# Patient Record
Sex: Female | Born: 2005 | Race: Black or African American | Hispanic: No | Marital: Single | Smoking: Never smoker
Health system: Southern US, Community
[De-identification: ages and names within clinical notes are randomized; demographics above are authoritative.]

## PROBLEM LIST (undated history)

## (undated) ENCOUNTER — Inpatient Hospital Stay (HOSPITAL_COMMUNITY): Payer: Self-pay

## (undated) DIAGNOSIS — J45909 Unspecified asthma, uncomplicated: Secondary | ICD-10-CM

## (undated) DIAGNOSIS — D649 Anemia, unspecified: Secondary | ICD-10-CM

## (undated) DIAGNOSIS — F32A Depression, unspecified: Secondary | ICD-10-CM

## (undated) DIAGNOSIS — F419 Anxiety disorder, unspecified: Secondary | ICD-10-CM

## (undated) HISTORY — PX: WISDOM TOOTH EXTRACTION: SHX21

---
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
## 2023-01-18 ENCOUNTER — Ambulatory Visit
Admission: EM | Admit: 2023-01-18 | Discharge: 2023-01-18 | Disposition: A | Discharge status: Home / Self Care | Payer: Medicaid Other | Attending: Internal Medicine | Admitting: Internal Medicine

## 2023-01-18 DIAGNOSIS — J4520 Mild intermittent asthma, uncomplicated: Secondary | ICD-10-CM

## 2023-01-18 MED ORDER — PREDNISONE 10 MG PO TABS: 30.0000 mg | ORAL_TABLET | Freq: Every day | ORAL | 0 refills | Status: DC

## 2023-01-18 MED ORDER — IPRATROPIUM-ALBUTEROL 0.5-2.5 (3) MG/3ML IN SOLN: 3.0000 mL | RESPIRATORY_TRACT | 0 refills | Status: DC | PRN

## 2023-01-18 MED ORDER — ALBUTEROL SULFATE HFA 108 (90 BASE) MCG/ACT IN AERS: 1.0000 | INHALATION_SPRAY | RESPIRATORY_TRACT | 0 refills | Status: DC | PRN

## 2023-01-18 NOTE — Discharge Instructions (Addendum)
Start prednisone to help with your lungs, breathing. Use the albuterol treatments as needed. Get rest, plenty of fluids.

## 2023-01-18 NOTE — ED Triage Notes (Signed)
Pt c/o left side CP and SHOB-using albuterol inhaler yesterday-states she has been having sx since having PNA last year-NAD-steady gait

## 2023-01-18 NOTE — ED Provider Notes (Signed)
[facility]  Note:  This document was prepared using Conservation officer, historic buildings and may include unintentional dictation errors.  MRN: [id number] DOB: 01-Jun-2005  Subjective:   Tina Castaneda is a 17 y.o. female presenting for 1 week history of persistent intermittent shortness of breath, chest tightness, left-sided chest pain.  She is out of her albuterol inhaler.  Has been borrowing her grandmothers nebulizer machine with temporary relief.  Would like to have a nebulizer machine of her own.  No smoking of any kind including cigarettes, cigars, vaping, marijuana use.    No current facility-administered medications for this encounter.  Current Outpatient Medications:    albuterol (VENTOLIN HFA) 108 (90 Base) MCG/ACT inhaler, Inhale 1-2 puffs into the lungs every 6 (six) hours as needed for wheezing or shortness of breath., Disp: 18 g, Rfl: 0   cyclobenzaprine (FLEXERIL) 5 MG tablet, Take 1 tablet (5 mg total) by mouth 3 (three) times daily as needed for muscle spasms., Disp: 30 tablet, Rfl: 0   hydrOXYzine (ATARAX) 25 MG tablet, Take 0.5 tablets (12.5 mg total) by mouth every 8 (eight) hours as needed for itching., Disp: 30 tablet, Rfl: 0   naproxen (NAPROSYN) 375 MG tablet, Take 1 tablet (375 mg total) by mouth 2 (two) times daily with a meal., Disp: 30 tablet, Rfl: 0   omeprazole (PRILOSEC) 20 MG capsule, Take 1 capsule (20 mg total) by mouth daily., Disp: 30 capsule, Rfl: 0   polyethylene glycol powder (MIRALAX) 17 GM/SCOOP powder, Take 255 g by mouth daily as needed for moderate constipation or severe constipation., Disp: 500 g, Rfl: 0   predniSONE (DELTASONE) 20 MG tablet, Take 1 tablet (20 mg total) by mouth daily with breakfast., Disp: 5 tablet, Rfl: 0   No Known Allergies  Past Medical History:  Diagnosis Date   Anemia    Anxiety    Asthma      History reviewed. No pertinent surgical history.  Family History  Problem Relation  Age of Onset   Asthma Sister    Asthma Maternal Grandmother     Social History   Tobacco Use   Smoking status: Never    Passive exposure: Never   Smokeless tobacco: Never  Vaping Use   Vaping status: Never Used  Substance Use Topics   Alcohol use: Not Currently   Drug use: Not Currently    Types: Marijuana    ROS   Objective:   Vitals: BP 112/75 (BP Location: Right Arm)   Pulse 87   Temp 98 F (36.7 C) (Oral)   Resp 16   LMP 01/08/2023   SpO2 99%   Physical Exam Constitutional:      General: She is not in acute distress.    Appearance: Normal appearance. She is well-developed. She is not ill-appearing, toxic-appearing or diaphoretic.  HENT:     Head: Normocephalic and atraumatic.     Right Ear: External ear normal.     Left Ear: External ear normal.     Nose: Nose normal.     Mouth/Throat:     Mouth: Mucous membranes are moist.  Eyes:     General: No scleral icterus.       Right eye: No discharge.        Left eye: No discharge.     Extraocular Movements: Extraocular movements intact.  Cardiovascular:     Rate and Rhythm: Normal rate and regular rhythm.     Heart sounds: Normal heart sounds. No  murmur heard.    No friction rub. No gallop.  Pulmonary:     Effort: Pulmonary effort is normal. No respiratory distress.     Breath sounds: No stridor. No wheezing, rhonchi or rales.     Comments: Slight decrease in lung sounds throughout. Chest:     Chest wall: No tenderness.  Skin:    General: Skin is warm and dry.  Neurological:     General: No focal deficit present.     Mental Status: She is alert and oriented to person, place, and time.  Psychiatric:        Mood and Affect: Mood normal.        Behavior: Behavior normal.    Provided patient with a nebulizer machine in clinic.  Assessment and Plan :   PDMP not reviewed this encounter.  1. Mild intermittent reactive airway disease without complication    Recommend supportive care with her nebulizer  treatments.  Use supportive care otherwise.  Counseled patient on potential for adverse effects with medications prescribed/recommended today, ER and return-to-clinic precautions discussed, patient verbalized understanding.    Wallis Bamberg, PA-C 01/18/23 1216

## 2023-02-08 ENCOUNTER — Ambulatory Visit: Payer: Medicaid Other | Admitting: Nurse Practitioner

## 2023-03-10 IMAGING — CT CT ABD-PELV W/ CM
2 of 4 series · 15 of 46 positions shown, 17 images · IV contrast (OMNIPAQUE 300)
Comparison: None.

CLINICAL DATA: Right lower quadrant pain with appendicitis
suspected.

EXAM:
CT ABDOMEN AND PELVIS WITH CONTRAST
TECHNIQUE: Multidetector CT imaging of the abdomen and pelvis was performed
using the standard protocol following bolus administration of
intravenous contrast.
CONTRAST:  80mL OMNIPAQUE IOHEXOL 300 MG/ML  SOLN

[Series 2: axial st · axial · 0.59mm/px · z∈[+1114,+1514]mm · 12 of 89 slices shown, 14 images]
[im 5/89  soft-tissue]
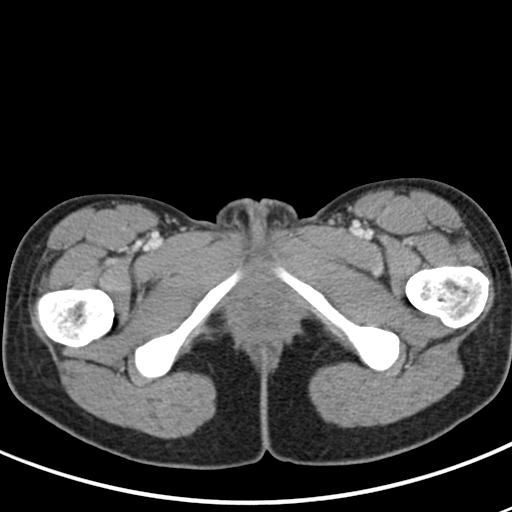
[im 5/89  bone]
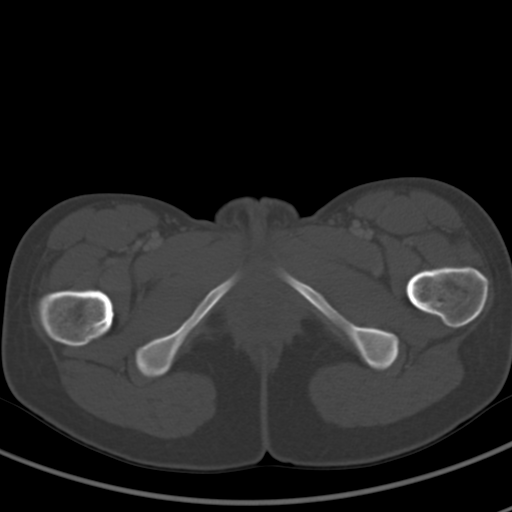
[im 13/89  soft-tissue]
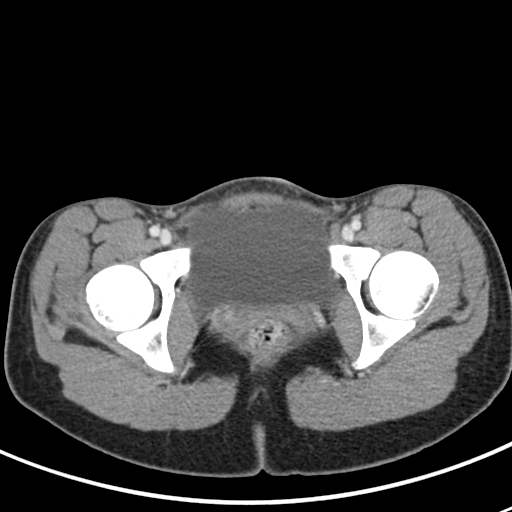
[im 21/89  soft-tissue]
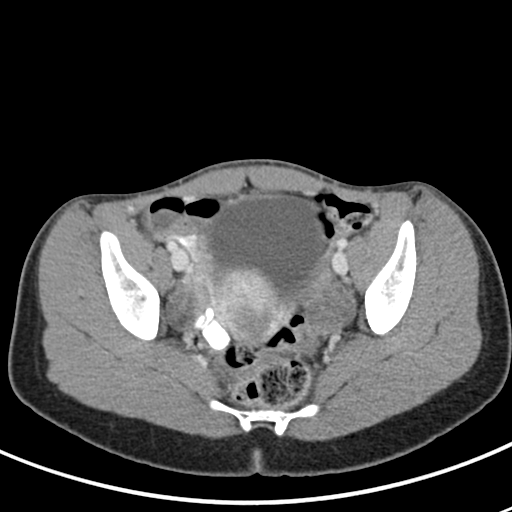
[im 29/89  soft-tissue]
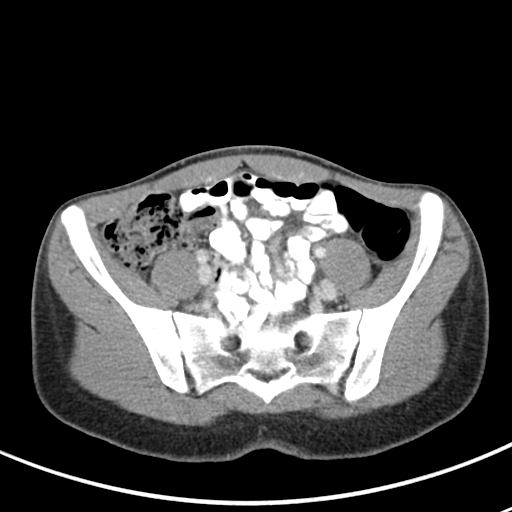
[im 33/89  soft-tissue]
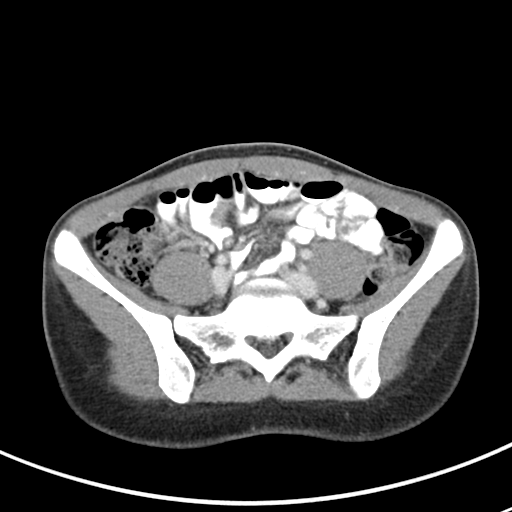
[im 41/89  soft-tissue]
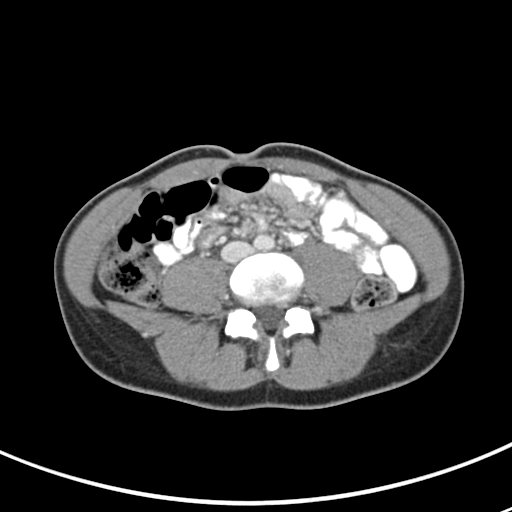
[im 49/89  soft-tissue]
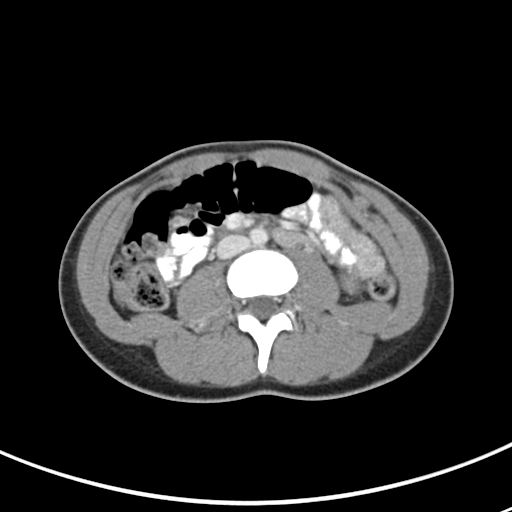
[im 57/89  soft-tissue]
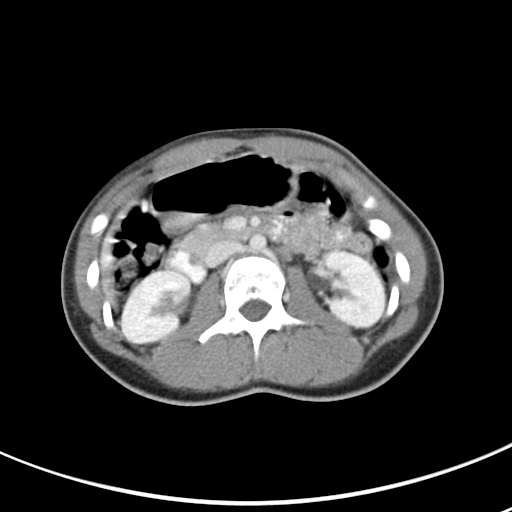
[im 61/89  soft-tissue]
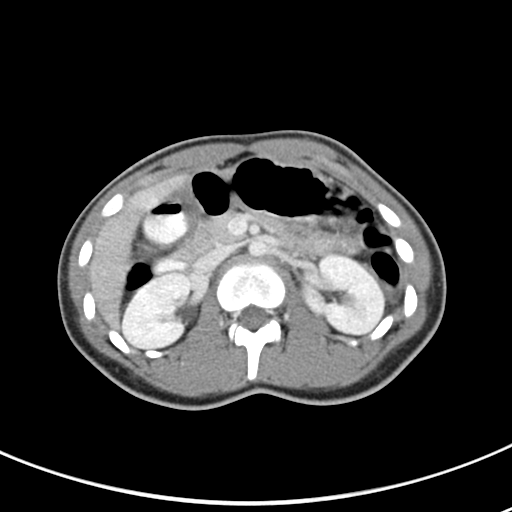
[im 61/89  bone]
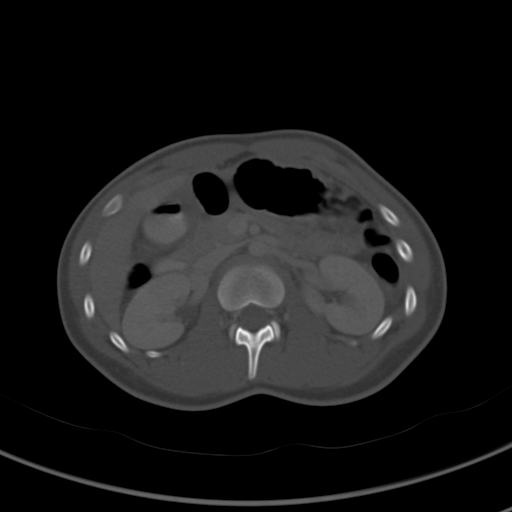
[im 69/89  soft-tissue]
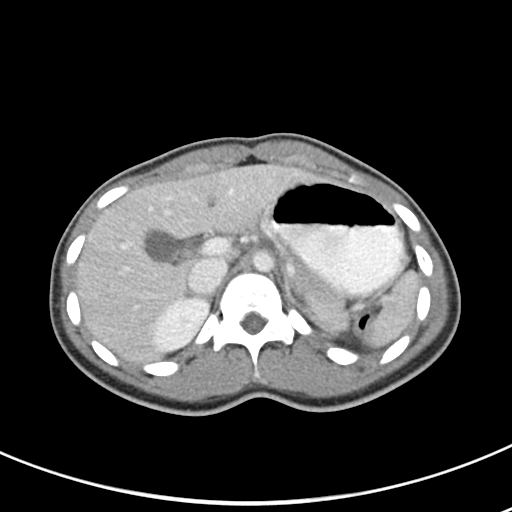
[im 77/89  soft-tissue]
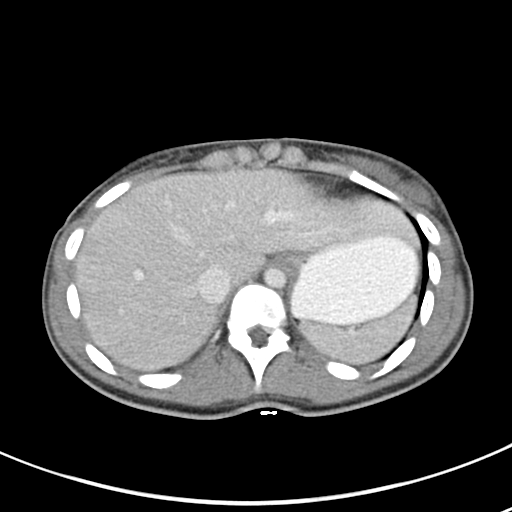
[im 85/89  soft-tissue]
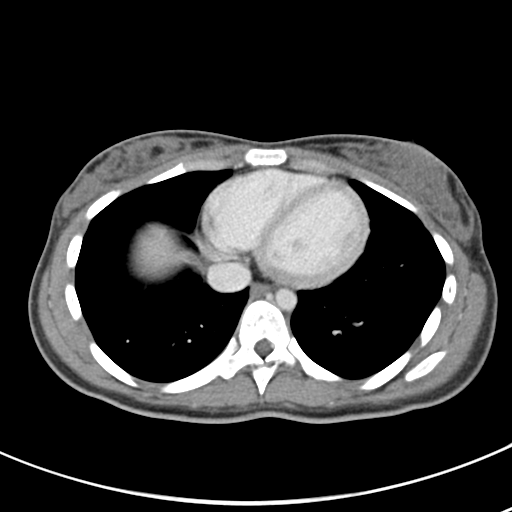

[Series 4: coronal st · coronal · 0.52mm/px · 3 of 100 slices shown]
[im 34/100  soft-tissue]
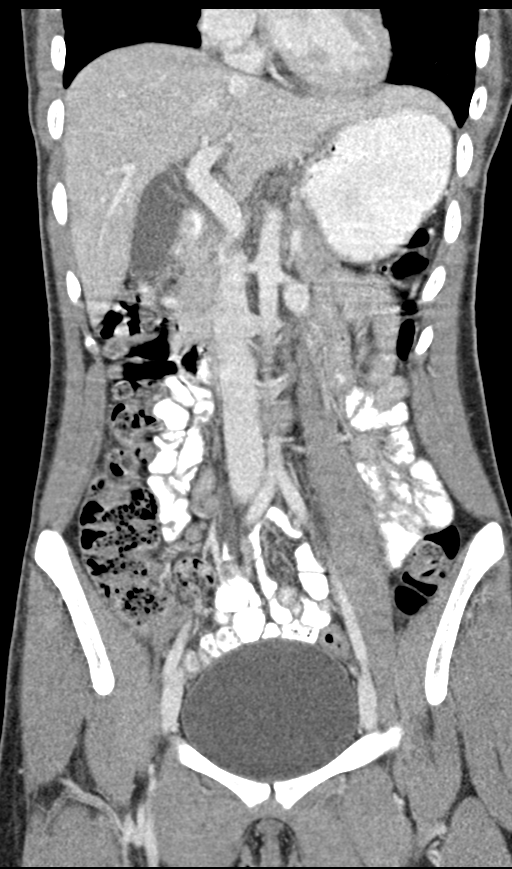
[im 45/100  soft-tissue]
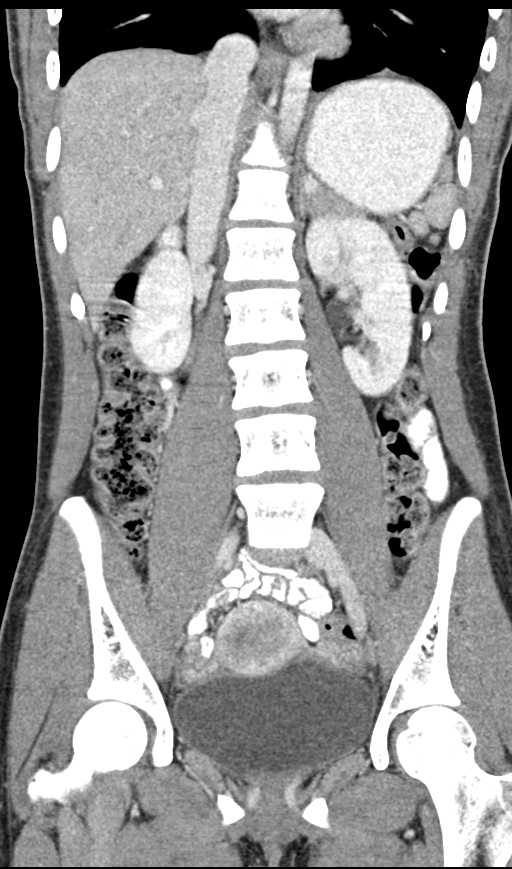
[im 56/100  soft-tissue]
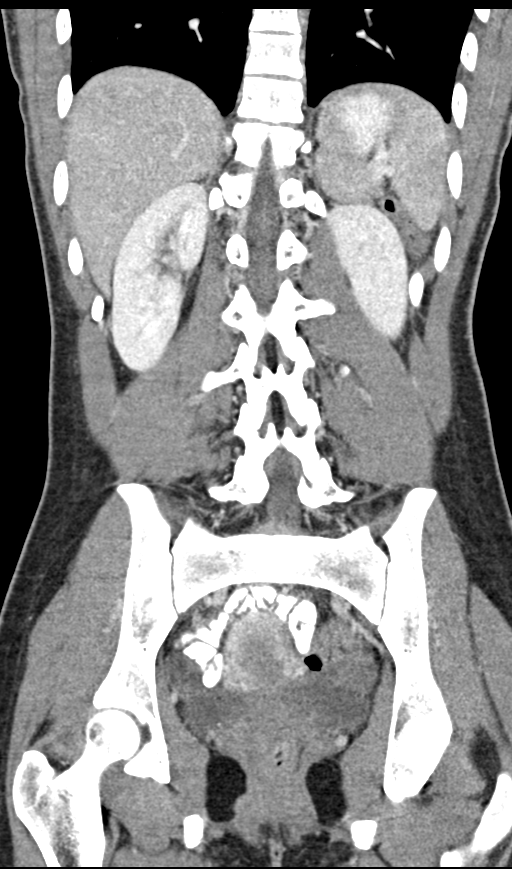

[15 of 46 positions shown; findings below may reference images not displayed]

FINDINGS: Lower chest:  No contributory findings.

Hepatobiliary: No focal liver abnormality.No evidence of biliary
obstruction or stone.

Pancreas: Unremarkable.

Spleen: Unremarkable.

Adrenals/Urinary Tract: Negative adrenals. No hydronephrosis or
stone. Unremarkable bladder.

Stomach/Bowel:  No obstruction. No appendicitis.

Vascular/Lymphatic: No acute vascular abnormality. No mass or
adenopathy.

Reproductive:Indistinct junction between the myometrium and
endometrium, nonspecific finding. No adnexal mass or visible
hydrosalpinx.

Other: Trace pelvic fluid, often physiologic.

Musculoskeletal: No acute abnormalities.
IMPRESSION: Negative for appendicitis or other specific cause of symptoms.

## 2023-03-11 ENCOUNTER — Encounter (HOSPITAL_BASED_OUTPATIENT_CLINIC_OR_DEPARTMENT_OTHER): Payer: Self-pay | Admitting: Emergency Medicine

## 2023-03-11 ENCOUNTER — Emergency Department (HOSPITAL_BASED_OUTPATIENT_CLINIC_OR_DEPARTMENT_OTHER): Payer: Medicaid Other

## 2023-03-11 ENCOUNTER — Other Ambulatory Visit: Payer: Self-pay

## 2023-03-11 ENCOUNTER — Emergency Department (HOSPITAL_BASED_OUTPATIENT_CLINIC_OR_DEPARTMENT_OTHER)
Admission: EM | Admit: 2023-03-11 | Discharge: 2023-03-11 | Disposition: A | Discharge status: Home / Self Care | Payer: Medicaid Other | Attending: Emergency Medicine | Admitting: Emergency Medicine

## 2023-03-11 DIAGNOSIS — O209 Hemorrhage in early pregnancy, unspecified: Secondary | ICD-10-CM | POA: Diagnosis present

## 2023-03-11 DIAGNOSIS — O26851 Spotting complicating pregnancy, first trimester: Secondary | ICD-10-CM | POA: Insufficient documentation

## 2023-03-11 DIAGNOSIS — Z3201 Encounter for pregnancy test, result positive: Secondary | ICD-10-CM | POA: Diagnosis not present

## 2023-03-11 DIAGNOSIS — N939 Abnormal uterine and vaginal bleeding, unspecified: Secondary | ICD-10-CM

## 2023-03-11 DIAGNOSIS — Z3A00 Weeks of gestation of pregnancy not specified: Secondary | ICD-10-CM | POA: Diagnosis not present

## 2023-03-11 DIAGNOSIS — R102 Pelvic and perineal pain: Secondary | ICD-10-CM

## 2023-03-11 LAB — PREGNANCY, URINE: Preg Test, Ur: POSITIVE — AB

## 2023-03-11 LAB — URINALYSIS, ROUTINE W REFLEX MICROSCOPIC
Bacteria, UA: NONE SEEN
Bilirubin Urine: NEGATIVE
Glucose, UA: NEGATIVE mg/dL
Hgb urine dipstick: NEGATIVE
Ketones, ur: NEGATIVE mg/dL
Nitrite: NEGATIVE
Protein, ur: NEGATIVE mg/dL
Specific Gravity, Urine: 1.024 (ref 1.005–1.030)
pH: 6.5 (ref 5.0–8.0)

## 2023-03-11 LAB — CBC
HCT: 39.4 % (ref 36.0–46.0)
Hemoglobin: 13.3 g/dL (ref 12.0–15.0)
MCH: 30.5 pg (ref 26.0–34.0)
MCHC: 33.8 g/dL (ref 30.0–36.0)
MCV: 90.4 fL (ref 80.0–100.0)
Platelets: 259 10*3/uL (ref 150–400)
RBC: 4.36 MIL/uL (ref 3.87–5.11)
RDW: 13.8 % (ref 11.5–15.5)
WBC: 5.7 10*3/uL (ref 4.0–10.5)
nRBC: 0 % (ref 0.0–0.2)

## 2023-03-11 LAB — HCG, QUANTITATIVE, PREGNANCY: hCG, Beta Chain, Quant, S: 1196 m[IU]/mL — ABNORMAL HIGH (ref <5)

## 2023-03-11 LAB — ABO/RH: ABO/RH(D): O POS

## 2023-03-11 NOTE — ED Provider Notes (Signed)
 Tina Castaneda EMERGENCY DEPARTMENT AT [facility] Provider Note   CSN: [id number] Arrival date & time: 03/11/23  0809     History  Chief Complaint  Patient presents with   Vaginal Bleeding    Tina Castaneda is a 18 y.o. female.   Vaginal Bleeding   18 year old female presents emergency department with complaints of vaginal spotting/pelvic pain.  Patient reports end of her last menstrual period 02/07/2023.  Patient states that she has not been pregnant before.  Had 4 at home positive pregnancy test.  States that pelvic pain as well as vaginal spotting began yesterday.  States that the bleeding/spotting has eased up and she has not noticed any since early this morning.  Reports the pelvic cramping pain has also improved.  Describes pelvic cramping pain as middle lower abdomen with some radiation to her back; states it feels similar to menstrual cramps.  Denies any fever, nausea, vomiting, urinary symptoms.  States she has establish care with OB/GYN but has not had initial appointment.  States that is scheduled in the coming weeks.  Denies blood thinner use.  Unaware of blood type.  Past medical history significant for anemia, anxiety, asthma  Home Medications Prior to Admission medications   Medication Sig Start Date End Date Taking? Authorizing Provider  albuterol  (VENTOLIN  HFA) 108 (90 Base) MCG/ACT inhaler Inhale 1-2 puffs into the lungs every 4 (four) hours as needed for wheezing or shortness of breath. 01/18/23   Christopher Savannah, PA-C  cyclobenzaprine  (FLEXERIL ) 5 MG tablet Take 1 tablet (5 mg total) by mouth 3 (three) times daily as needed for muscle spasms. 07/24/22   Christopher Savannah, PA-C  hydrOXYzine  (ATARAX ) 25 MG tablet Take 0.5 tablets (12.5 mg total) by mouth every 8 (eight) hours as needed for itching. 10/27/22   Christopher Savannah, PA-C  ipratropium-albuterol  (DUONEB) 0.5-2.5 (3) MG/3ML SOLN Take 3 mLs by nebulization every 4 (four) hours as needed. 01/18/23   Christopher Savannah, PA-C  naproxen  (NAPROSYN ) 375 MG tablet Take 1 tablet (375 mg total) by mouth 2 (two) times daily with a meal. 07/24/22   Christopher Savannah, PA-C  omeprazole  (PRILOSEC) 20 MG capsule Take 1 capsule (20 mg total) by mouth daily. 10/08/22   Enedelia Dorna HERO, FNP  polyethylene glycol powder (MIRALAX ) 17 GM/SCOOP powder Take 255 g by mouth daily as needed for moderate constipation or severe constipation. 02/25/22   Christopher Savannah, PA-C  predniSONE  (DELTASONE ) 10 MG tablet Take 3 tablets (30 mg total) by mouth daily with breakfast. 01/18/23   Christopher Savannah, PA-C      Allergies    Patient has no known allergies.    Review of Systems   Review of Systems  Genitourinary:  Positive for vaginal bleeding.  All other systems reviewed and are negative.   Physical Exam Updated Vital Signs BP 121/74 (BP Location: Right Arm)   Pulse 69   Temp 98 F (36.7 C)   Resp 18   Wt 44.5 kg   LMP 02/07/2023   SpO2 100%   BMI 17.36 kg/m  Physical Exam Vitals and nursing note reviewed.  Constitutional:      General: She is not in acute distress.    Appearance: She is well-developed.  HENT:     Head: Normocephalic and atraumatic.  Eyes:     Conjunctiva/sclera: Conjunctivae normal.  Cardiovascular:     Rate and Rhythm: Normal rate and regular rhythm.     Heart sounds: No murmur heard. Pulmonary:     Effort:  Pulmonary effort is normal. No respiratory distress.     Breath sounds: Normal breath sounds.  Abdominal:     Palpations: Abdomen is soft.     Tenderness: There is no abdominal tenderness. There is no right CVA tenderness, left CVA tenderness or guarding.  Musculoskeletal:        General: No swelling.     Cervical back: Neck supple.  Skin:    General: Skin is warm and dry.     Capillary Refill: Capillary refill takes less than 2 seconds.  Neurological:     Mental Status: She is alert.  Psychiatric:        Mood and Affect: Mood normal.     ED Results / Procedures / Treatments   Labs (all  labs ordered are listed, but only abnormal results are displayed) Labs Reviewed  PREGNANCY, URINE - Abnormal; Notable for the following components:      Result Value   Preg Test, Ur POSITIVE (*)    All other components within normal limits  HCG, QUANTITATIVE, PREGNANCY - Abnormal; Notable for the following components:   hCG, Beta Chain, Quant, S 1,196 (*)    All other components within normal limits  URINALYSIS, ROUTINE W REFLEX MICROSCOPIC - Abnormal; Notable for the following components:   Leukocytes,Ua MODERATE (*)    All other components within normal limits  CBC  ABO/RH    EKG None  Radiology US  OB LESS THAN 14 WEEKS WITH OB TRANSVAGINAL Result Date: 03/11/2023 CLINICAL DATA:  Abdominal pain EXAM: OBSTETRIC <14 WK US  AND TRANSVAGINAL OB US  TECHNIQUE: Both transabdominal and transvaginal ultrasound examinations were performed for complete evaluation of the gestation as well as the maternal uterus, adnexal regions, and pelvic cul-de-sac. Transvaginal technique was performed to assess early pregnancy. COMPARISON:  None Available. FINDINGS: Intrauterine gestational sac: None Yolk sac:  Not Visualized. Embryo:  Not Visualized. Cardiac Activity: Not Visualized. Maternal uterus/adnexae: Normal ovaries. Small amount of free fluid in the pelvis. IMPRESSION: No intrauterine gestation. In the setting of positive pregnancy test and no definite intrauterine pregnancy, this reflects a pregnancy of unknown location. Differential considerations include early normal IUP, abnormal IUP, or nonvisualized ectopic pregnancy. Differentiation is achieved with serial beta HCG supplemented by repeat sonography as clinically warranted. Electronically Signed   By: Bard Moats M.D.   On: 03/11/2023 10:37    Procedures Procedures    Medications Ordered in ED Medications - No data to display  ED Course/ Medical Decision Making/ A&P                                 Medical Decision Making Amount and/or  Complexity of Data Reviewed Labs: ordered. Radiology: ordered.   This patient presents to the ED for concern of vaginal bleeding, this involves an extensive number of treatment options, and is a complaint that carries with it a high risk of complications and morbidity.  The differential diagnosis includes miscarriage, subchorionic hemorrhage, coagulopathy, fibroid, ovarian dysfunction, malignancy, tubo-ovarian abscess, ectopic pregnancy, endometriosis, other   Co morbidities that complicate the patient evaluation  See HPI   Additional history obtained:  Additional history obtained from EMR External records from outside source obtained and reviewed including hospital records   Lab Tests:  I Ordered, and personally interpreted labs.  The pertinent results include: No leukocytosis.  No evidence of anemia.  Platelets within range.  UA with 6-10 squamous epithelial cells, 6-10 WBCs with moderate leukocytes.  Urine pregnancy positive.  ABO/Rh O+.  Beta-hCG quantitative 1196   Imaging Studies ordered:  I ordered imaging studies including pelvic ultrasound I independently visualized and interpreted imaging which showed no identified IUP. I agree with the radiologist interpretation   Cardiac Monitoring: / EKG:  The patient was maintained on a cardiac monitor.  I personally viewed and interpreted the cardiac monitored which showed an underlying rhythm of: Sinus rhythm   Consultations Obtained:  N/a   Problem List / ED Course / Critical interventions / Medication management  Positive hCG, pelvic pain, vaginal bleeding Reevaluation of the patient showed that the patient stayed the same I have reviewed the patients home medicines and have made adjustments as needed   Social Determinants of Health:  Denies tobacco, illicit drug use   Test / Admission - Considered:  Positive hCG, pelvic pain, vaginal bleeding Vitals signs within normal range and stable throughout  visit. Laboratory/imaging studies significant for: See above 18 year old female presents emergency department with complaints of lower abdominal cramping, vaginal spotting positive pregnancy test.  Patient reports symptoms beginning yesterday.  Workup today overall reassuring.  hCG levels within patient's reported end of last menstrual period date of 02/07/2023.  Ultrasound did not show evidence of intrauterine or extrauterine pregnancy.  Suspect given hCG level too early to visualize intrauterine pregnancy.  Still some concern for ectopic pregnancy however.  Recommend follow-up with OB/GYN within the next 2 days for reassessment of hCG levels.  Will recommend beginning a prenatal care as discussed in AVS.  Treatment plan discussed at length with patient and she acknowledged understanding was agreeable to said plan.  Patient overall well-appearing, afebrile in no acute distress. Worrisome signs and symptoms were discussed with the patient, and the patient acknowledged understanding to return to the ED if noticed. Patient was stable upon discharge.          Final Clinical Impression(s) / ED Diagnoses Final diagnoses:  Positive blood pregnancy test  Vaginal spotting  Pelvic pain    Rx / DC Orders ED Discharge Orders     None         Silver Wonda LABOR, PA 03/11/23 1423    Ellouise Fine K, DO 03/11/23 1502

## 2023-03-11 NOTE — ED Triage Notes (Signed)
 Pt reports positive home preg, x 3 days pta c/o lower ABD cramping and vaginal bleeding today

## 2023-03-11 NOTE — Discharge Instructions (Addendum)
 As discussed, your hCG levels are on track with the end of your last menstrual cycle.  The ultrasound did not show obvious intrauterine or extrauterine pregnancy.  There is still some concern that pregnancy could be outside of the uterus.  Recommend follow-up with your OB/GYN within the next 2 days for repeat hCG levels.  Begin prenatal vitamins.  Follow MyChart for the results of your ABO/Rh testing and if negative, please return for RhoGAM.

## 2023-03-20 ENCOUNTER — Ambulatory Visit
Admission: EM | Admit: 2023-03-20 | Discharge: 2023-03-20 | Disposition: A | Discharge status: Home / Self Care | Payer: Medicaid Other | Attending: Family Medicine | Admitting: Family Medicine

## 2023-03-20 DIAGNOSIS — O98511 Other viral diseases complicating pregnancy, first trimester: Secondary | ICD-10-CM | POA: Diagnosis not present

## 2023-03-20 DIAGNOSIS — B9789 Other viral agents as the cause of diseases classified elsewhere: Secondary | ICD-10-CM | POA: Diagnosis not present

## 2023-03-20 DIAGNOSIS — R051 Acute cough: Secondary | ICD-10-CM | POA: Diagnosis not present

## 2023-03-20 DIAGNOSIS — J988 Other specified respiratory disorders: Secondary | ICD-10-CM

## 2023-03-20 NOTE — ED Provider Notes (Signed)
[facility]  Note:  This document was prepared using Conservation officer, historic buildings and may include unintentional dictation errors.  MRN: [id number] DOB: [birth year]  Subjective:   Tina Castaneda is a 18 y.o. female [redacted] weeks pregnant presenting for 3-day history of coughing, runny nose, mild throat pain.  No body pains, headaches, ear pain, chest pain, shortness of breath, heart racing, abdominal pain, vaginal bleeding.  No current facility-administered medications for this encounter.  Current Outpatient Medications:    albuterol (VENTOLIN HFA) 108 (90 Base) MCG/ACT inhaler, Inhale 1-2 puffs into the lungs every 4 (four) hours as needed for wheezing or shortness of breath., Disp: 18 g, Rfl: 0   cyclobenzaprine (FLEXERIL) 5 MG tablet, Take 1 tablet (5 mg total) by mouth 3 (three) times daily as needed for muscle spasms., Disp: 30 tablet, Rfl: 0   hydrOXYzine (ATARAX) 25 MG tablet, Take 0.5 tablets (12.5 mg total) by mouth every 8 (eight) hours as needed for itching., Disp: 30 tablet, Rfl: 0   ipratropium-albuterol (DUONEB) 0.5-2.5 (3) MG/3ML SOLN, Take 3 mLs by nebulization every 4 (four) hours as needed., Disp: 75 mL, Rfl: 0   naproxen (NAPROSYN) 375 MG tablet, Take 1 tablet (375 mg total) by mouth 2 (two) times daily with a meal., Disp: 30 tablet, Rfl: 0   omeprazole (PRILOSEC) 20 MG capsule, Take 1 capsule (20 mg total) by mouth daily., Disp: 30 capsule, Rfl: 0   polyethylene glycol powder (MIRALAX) 17 GM/SCOOP powder, Take 255 g by mouth daily as needed for moderate constipation or severe constipation., Disp: 500 g, Rfl: 0   predniSONE (DELTASONE) 10 MG tablet, Take 3 tablets (30 mg total) by mouth daily with breakfast., Disp: 15 tablet, Rfl: 0   No Known Allergies  Past Medical History:  Diagnosis Date   Anemia    Anxiety    Asthma      History reviewed. No pertinent surgical history.  Family History  Problem Relation Age of Onset    Asthma Sister    Asthma Maternal Grandmother     Social History   Tobacco Use   Smoking status: Never    Passive exposure: Never   Smokeless tobacco: Never  Vaping Use   Vaping status: Never Used  Substance Use Topics   Alcohol use: Not Currently   Drug use: Not Currently    Types: Marijuana    ROS   Objective:   Vitals: BP 109/72 (BP Location: Right Arm)   Pulse (!) 107   Temp 98.6 F (37 C) (Oral)   Resp 16   LMP 02/07/2023   SpO2 97%   Physical Exam Constitutional:      General: She is not in acute distress.    Appearance: Normal appearance. She is well-developed and normal weight. She is not ill-appearing, toxic-appearing or diaphoretic.  HENT:     Head: Normocephalic and atraumatic.     Right Ear: Tympanic membrane, ear canal and external ear normal. No drainage or tenderness. No middle ear effusion. There is no impacted cerumen. Tympanic membrane is not erythematous or bulging.     Left Ear: Tympanic membrane, ear canal and external ear normal. No drainage or tenderness.  No middle ear effusion. There is no impacted cerumen. Tympanic membrane is not erythematous or bulging.     Nose: Nose normal. No congestion or rhinorrhea.     Mouth/Throat:     Mouth: Mucous membranes are moist. No oral lesions.     Pharynx: No  pharyngeal swelling, oropharyngeal exudate, posterior oropharyngeal erythema or uvula swelling.     Tonsils: No tonsillar exudate or tonsillar abscesses.  Eyes:     General: No scleral icterus.       Right eye: No discharge.        Left eye: No discharge.     Extraocular Movements: Extraocular movements intact.     Right eye: Normal extraocular motion.     Left eye: Normal extraocular motion.     Conjunctiva/sclera: Conjunctivae normal.  Cardiovascular:     Rate and Rhythm: Normal rate and regular rhythm.     Heart sounds: Normal heart sounds. No murmur heard.    No friction rub. No gallop.  Pulmonary:     Effort: Pulmonary effort is normal. No  respiratory distress.     Breath sounds: No stridor. No wheezing, rhonchi or rales.  Chest:     Chest wall: No tenderness.  Musculoskeletal:     Cervical back: Normal range of motion and neck supple.  Lymphadenopathy:     Cervical: No cervical adenopathy.  Skin:    General: Skin is warm and dry.  Neurological:     General: No focal deficit present.     Mental Status: She is alert and oriented to person, place, and time.  Psychiatric:        Mood and Affect: Mood normal.        Behavior: Behavior normal.     Assessment and Plan :   PDMP not reviewed this encounter.  1. Viral respiratory illness   2. Acute cough   3. Viral infectious disease in mother during first trimester of pregnancy    Initially patient requested respiratory testing and then changed her mind. Suspect viral URI, viral syndrome. Physical exam findings reassuring and vital signs stable for discharge. Advised supportive care, offered symptomatic relief. Counseled patient on potential for adverse effects with medications prescribed/recommended today, ER and return-to-clinic precautions discussed, patient verbalized understanding.     Wallis Bamberg, [place] 03/20/23 [phone]

## 2023-03-20 NOTE — Discharge Instructions (Signed)
We will manage this as a viral respiratory illness. For sore throat or cough try using a honey-based tea. Use 3 teaspoons of honey with juice squeezed from half lemon. Place shaved pieces of ginger into 1/2-1 cup of water and warm over stove top. Then mix the ingredients and repeat every 4 hours as needed. Please take Tylenol 500mg -650mg  once every 6 hours for fevers, aches and pains. Hydrate very well with at least 2 liters (64 ounces) of water. Eat light meals such as soups (chicken and noodles, chicken wild rice, vegetable).  Do not eat any foods that you are allergic to.  Start an antihistamine like Zyrtec (10mg  daily) for postnasal drainage, sinus congestion.  You can take this together with pseudoephedrine (Sudafed) at a dose of 30 mg 2 times a day or twice daily as needed for the same kind of congestion.  You can also use Flonase nasal spray, 2 sprays to each nostril once daily. Use non-alcohol cough drops as needed.

## 2023-03-20 NOTE — ED Triage Notes (Signed)
Pt reports cough, runny nose, sore throat x 3 days. Pt is [redacted] weeks pregnant.

## 2023-03-27 ENCOUNTER — Other Ambulatory Visit: Payer: Self-pay

## 2023-03-27 ENCOUNTER — Emergency Department (HOSPITAL_BASED_OUTPATIENT_CLINIC_OR_DEPARTMENT_OTHER)
Admission: EM | Admit: 2023-03-27 | Discharge: 2023-03-27 | Disposition: A | Discharge status: Home / Self Care | Payer: Medicaid Other | Attending: Emergency Medicine | Admitting: Emergency Medicine

## 2023-03-27 DIAGNOSIS — Z3A01 Less than 8 weeks gestation of pregnancy: Secondary | ICD-10-CM | POA: Insufficient documentation

## 2023-03-27 DIAGNOSIS — O210 Mild hyperemesis gravidarum: Secondary | ICD-10-CM | POA: Diagnosis present

## 2023-03-27 LAB — CBC
HCT: 35.7 % — ABNORMAL LOW (ref 36.0–46.0)
Hemoglobin: 12 g/dL (ref 12.0–15.0)
MCH: 30.3 pg (ref 26.0–34.0)
MCHC: 33.6 g/dL (ref 30.0–36.0)
MCV: 90.2 fL (ref 80.0–100.0)
Platelets: 236 10*3/uL (ref 150–400)
RBC: 3.96 MIL/uL (ref 3.87–5.11)
RDW: 13.4 % (ref 11.5–15.5)
WBC: 7.4 10*3/uL (ref 4.0–10.5)
nRBC: 0 % (ref 0.0–0.2)

## 2023-03-27 LAB — COMPREHENSIVE METABOLIC PANEL
ALT: 8 U/L (ref 0–44)
AST: 13 U/L — ABNORMAL LOW (ref 15–41)
Albumin: 4.2 g/dL (ref 3.5–5.0)
Alkaline Phosphatase: 45 U/L (ref 38–126)
Anion gap: 10 (ref 5–15)
BUN: 6 mg/dL (ref 6–20)
CO2: 23 mmol/L (ref 22–32)
Calcium: 9 mg/dL (ref 8.9–10.3)
Chloride: 102 mmol/L (ref 98–111)
Creatinine, Ser: 0.62 mg/dL (ref 0.44–1.00)
GFR, Estimated: >60 mL/min (ref >60)
Glucose, Bld: 79 mg/dL (ref 70–99)
Potassium: 3.3 mmol/L — ABNORMAL LOW (ref 3.5–5.1)
Sodium: 135 mmol/L (ref 135–145)
Total Bilirubin: 0.6 mg/dL (ref 0.0–1.2)
Total Protein: 6.8 g/dL (ref 6.5–8.1)

## 2023-03-27 LAB — LIPASE, BLOOD: Lipase: 13 U/L (ref 11–51)

## 2023-03-27 LAB — HCG, QUANTITATIVE, PREGNANCY: hCG, Beta Chain, Quant, S: 97668 m[IU]/mL — ABNORMAL HIGH (ref <5)

## 2023-03-27 MED ORDER — ONDANSETRON HCL 4 MG/2ML IJ SOLN
4.0000 mg | Freq: Once | INTRAMUSCULAR | Status: AC
  Administered 2023-03-27: 4 mg via INTRAVENOUS
  Filled 2023-03-27: qty 2

## 2023-03-27 MED ORDER — ONDANSETRON HCL 4 MG PO TABS: 4.0000 mg | ORAL_TABLET | Freq: Four times a day (QID) | ORAL | 0 refills | Status: AC

## 2023-03-27 MED ORDER — POTASSIUM CHLORIDE 20 MEQ PO PACK
40.0000 meq | PACK | Freq: Once | ORAL | Status: AC
  Administered 2023-03-27: 40 meq via ORAL
  Filled 2023-03-27: qty 2

## 2023-03-27 MED ORDER — LACTATED RINGERS IV BOLUS
1000.0000 mL | Freq: Once | INTRAVENOUS | Status: AC
  Administered 2023-03-27: 1000 mL via INTRAVENOUS

## 2023-03-27 NOTE — ED Provider Notes (Signed)
[facility] AT [facility] Provider Note   CSN: [id number] Arrival date & time: 03/27/23  1714     History Chief Complaint  Patient presents with   Emesis    6wk preg    HPI Tina Castaneda is a 18 y.o. female presenting for mild abdominal pain and intolerance of PO intake today.  Patient's recorded medical, surgical, social, medication list and allergies were reviewed in the Snapshot window as part of the initial history.   Review of Systems   Review of Systems  Constitutional:  Negative for chills and fever.  HENT:  Negative for ear pain and sore throat.   Eyes:  Negative for pain and visual disturbance.  Respiratory:  Negative for cough and shortness of breath.   Cardiovascular:  Negative for chest pain and palpitations.  Gastrointestinal:  Positive for abdominal pain, nausea and vomiting.  Genitourinary:  Negative for dysuria and hematuria.  Musculoskeletal:  Negative for arthralgias and back pain.  Skin:  Negative for color change and rash.  Neurological:  Negative for seizures and syncope.  All other systems reviewed and are negative.   Physical Exam Updated Vital Signs BP 109/67   Pulse 73   Temp 98.2 F (36.8 C)   Resp 15   LMP 02/07/2023   SpO2 100%  Physical Exam Vitals and nursing note reviewed.  Constitutional:      General: She is not in acute distress.    Appearance: She is well-developed.  HENT:     Head: Normocephalic and atraumatic.  Eyes:     Conjunctiva/sclera: Conjunctivae normal.  Cardiovascular:     Rate and Rhythm: Normal rate and regular rhythm.     Heart sounds: No murmur heard. Pulmonary:     Effort: Pulmonary effort is normal. No respiratory distress.     Breath sounds: Normal breath sounds.  Abdominal:     General: There is no distension.     Palpations: Abdomen is soft.     Tenderness: There is no abdominal tenderness. There is no right CVA tenderness or left CVA tenderness.   Musculoskeletal:        General: No swelling or tenderness. Normal range of motion.     Cervical back: Neck supple.  Skin:    General: Skin is warm and dry.  Neurological:     General: No focal deficit present.     Mental Status: She is alert and oriented to person, place, and time. Mental status is at baseline.     Cranial Nerves: No cranial nerve deficit.      ED Course/ Medical Decision Making/ A&P    Procedures Procedures   Medications Ordered in ED Medications  lactated ringers bolus 1,000 mL (0 mLs Intravenous Stopped 03/27/23 2211)  ondansetron (ZOFRAN) injection 4 mg (4 mg Intravenous Given 03/27/23 2059)  potassium chloride (KLOR-CON) packet 40 mEq (40 mEq Oral Given 03/27/23 2101)    Medical Decision Making:    Tina Castaneda is a 18 y.o. female who presented to the ED today with N/V detailed above.     Patient placed on continuous vitals and telemetry monitoring while in ED which was reviewed periodically.   Complete initial physical exam performed, notably the patient  was HDS in NAD.      Reviewed and confirmed nursing documentation for past medical history, family history, social history.    Initial Assessment:   With the patient's presentation of N/V, most likely diagnosis is HG. Other diagnoses were  considered including (but not limited to) appendicitis, cholecystis, SBO. These are considered less likely due to history of present illness and physical exam findings.   This is most consistent with an acute life/limb threatening illness complicated by underlying chronic conditions.  Initial Plan:  Screening labs including CBC and Metabolic panel to evaluate for infectious or metabolic etiology of disease.  Objective evaluation as below reviewed with plan for close reassessment  Initial Study Results:   Laboratory  All laboratory results reviewed without evidence of clinically relevant pathology.    Reassessment and Plan:   On my serial  reassessment, patient is tolerating p.o. intake.  She is gone IV fluid clinically appears well. Considered ruptured ectopic, spontaneous abortion and while these are possible, her clinical history seems grossly inconsistent.  Given improvement with Zofran we will continue her in the outpatient setting.  Discussed risk of this medication in pregnancy per recent papers and patient stated she would discuss it with her gynecologist who she stated she had close follow-up with.  No acute distress stable for outpatient care and management.  Considered likely just with patient stated she would not be able to afford it.    Disposition:  I have considered need for hospitalization, however, considering all of the above, I believe this patient is stable for discharge at this time.  Patient/family educated about specific return precautions for given chief complaint and symptoms.  Patient/family educated about follow-up with PCP.     Patient/family expressed understanding of return precautions and need for follow-up. Patient spoken to regarding all imaging and laboratory results and appropriate follow up for these results. All education provided in verbal form with additional information in written form. Time was allowed for answering of patient questions. Patient discharged.    Emergency Department Medication Summary:   Medications  lactated ringers bolus 1,000 mL (0 mLs Intravenous Stopped 03/27/23 2211)  ondansetron (ZOFRAN) injection 4 mg (4 mg Intravenous Given 03/27/23 2059)  potassium chloride (KLOR-CON) packet 40 mEq (40 mEq Oral Given 03/27/23 2101)         Clinical Impression:  1. Hyperemesis gravidarum      Discharge   Final Clinical Impression(s) / ED Diagnoses Final diagnoses:  Hyperemesis gravidarum    Rx / DC Orders ED Discharge Orders          Ordered    ondansetron (ZOFRAN) 4 MG tablet  Every 6 hours        03/27/23 2214              Glyn Ade, MD 03/27/23  2253

## 2023-03-27 NOTE — ED Triage Notes (Signed)
N/V. Estimates 6-7 weeks. Spotting.  Feels fatigued.

## 2023-03-31 IMAGING — CT CT ABD-PELV W/ CM
2 of 4 series · 16 of 46 positions shown, 18 images · IV contrast (Omni 300)
Comparison: 05/24/2020

CLINICAL DATA: Right lower quadrant abdominal pain, history of
pelvic inflammatory disease 3 weeks ago, back pain, nausea

EXAM:
CT ABDOMEN AND PELVIS WITH CONTRAST
TECHNIQUE: Multidetector CT imaging of the abdomen and pelvis was performed
using the standard protocol following bolus administration of
intravenous contrast.
CONTRAST:  75mL OMNIPAQUE IOHEXOL 300 MG/ML  SOLN

[Series 3: a/p w/ 5mm · axial · 0.54mm/px · z∈[-420,-35]mm · 13 of 85 slices shown, 15 images]
[im 4/85  soft-tissue]
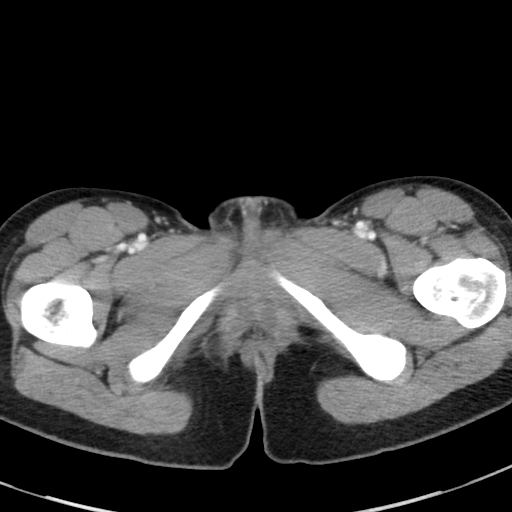
[im 4/85  bone]
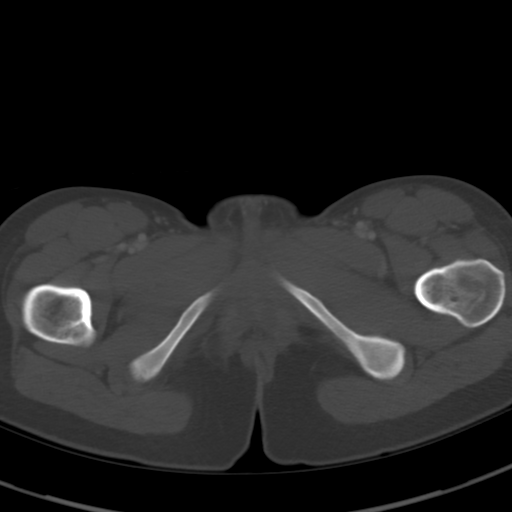
[im 10/85  soft-tissue]
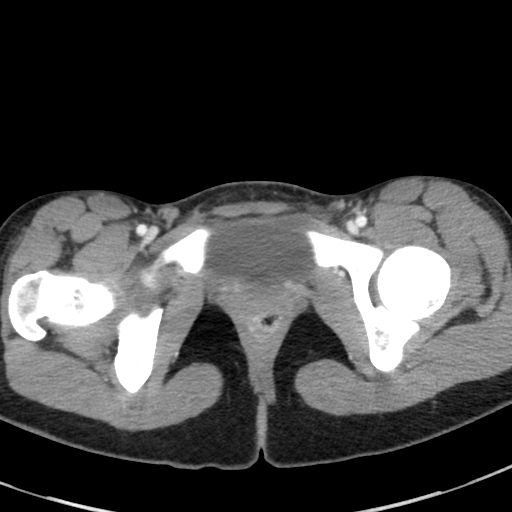
[im 17/85  soft-tissue]
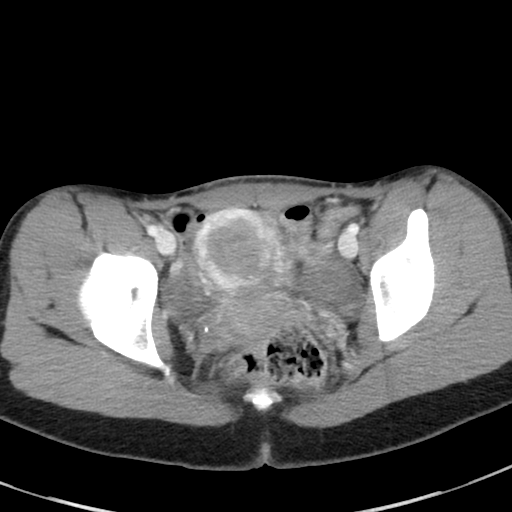
[im 23/85  soft-tissue]
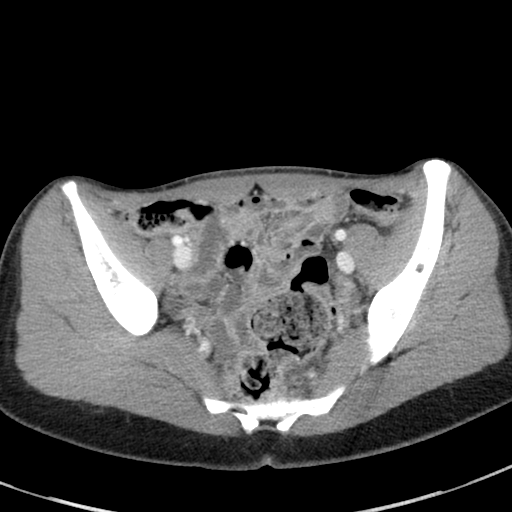
[im 30/85  soft-tissue]
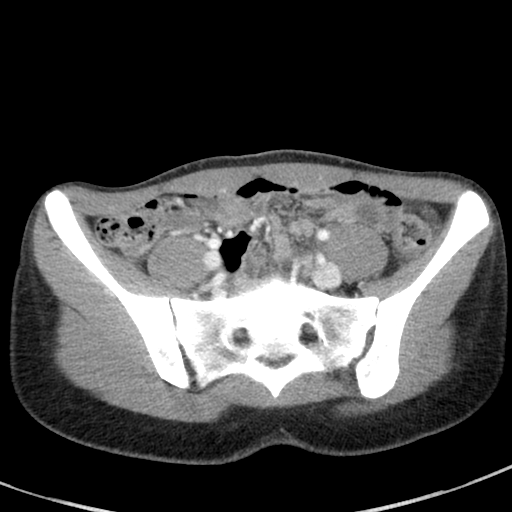
[im 36/85  soft-tissue]
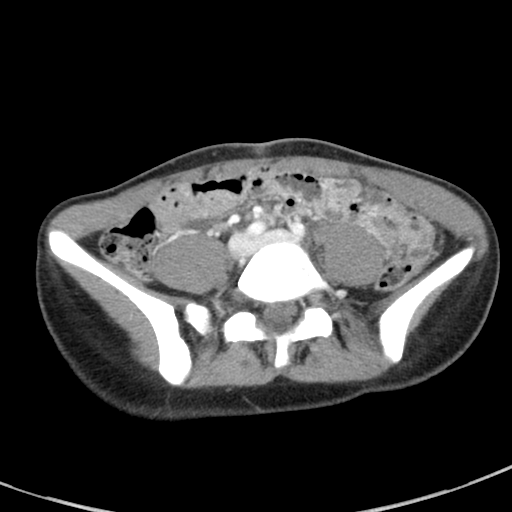
[im 43/85  soft-tissue]
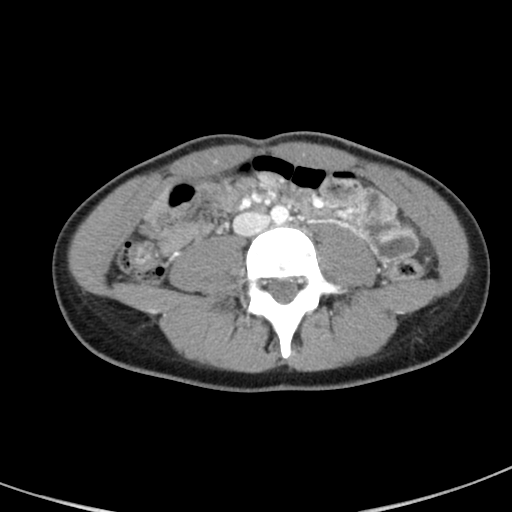
[im 49/85  soft-tissue]
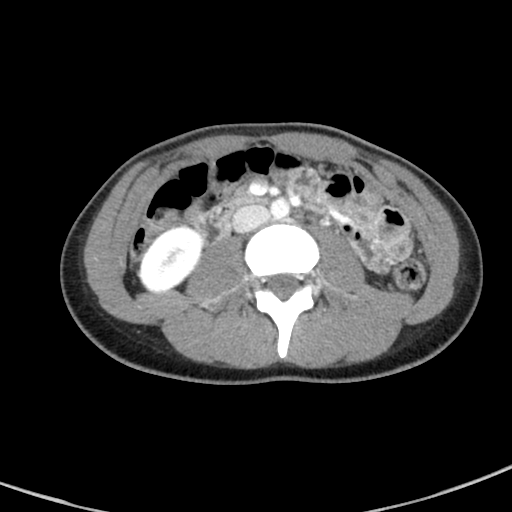
[im 55/85  soft-tissue]
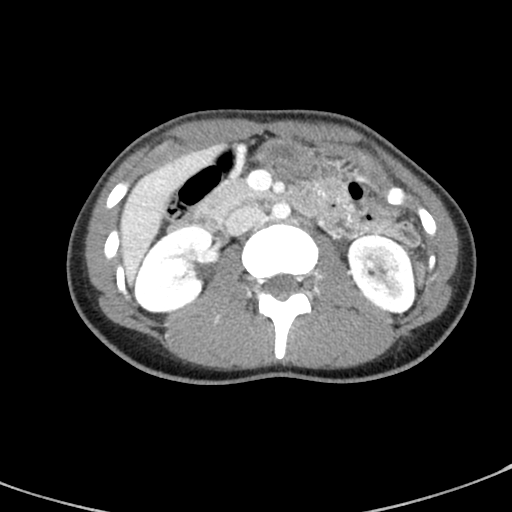
[im 55/85  bone]
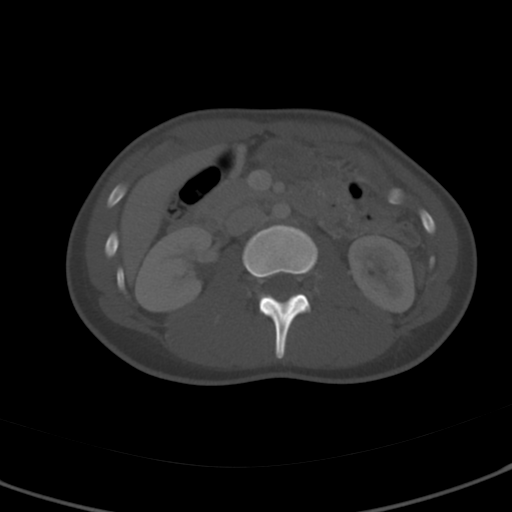
[im 62/85  soft-tissue]
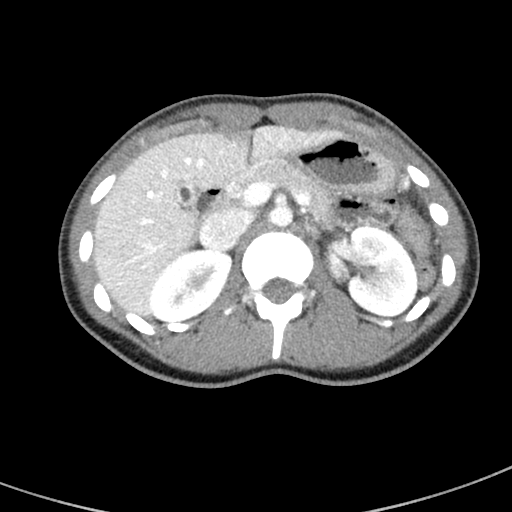
[im 68/85  soft-tissue]
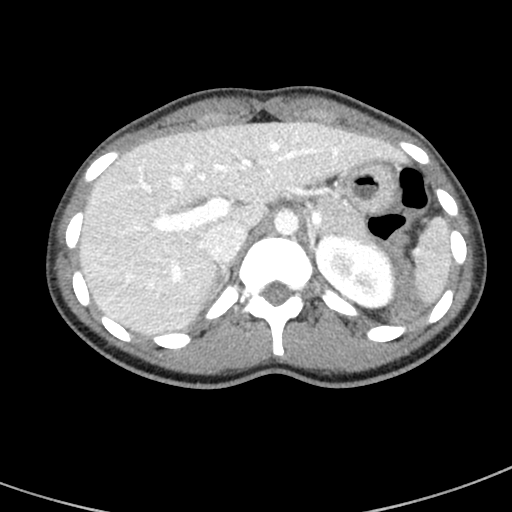
[im 75/85  soft-tissue]
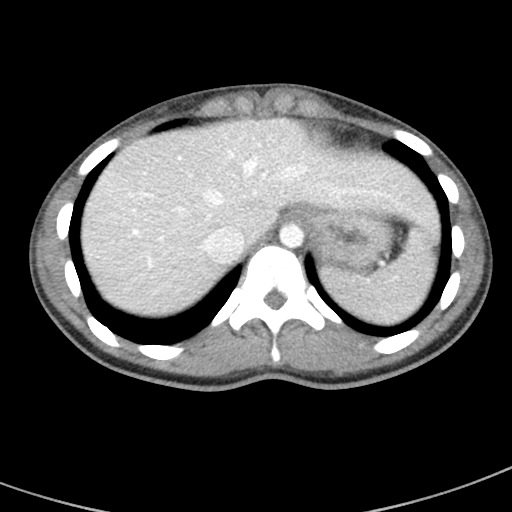
[im 81/85  soft-tissue]
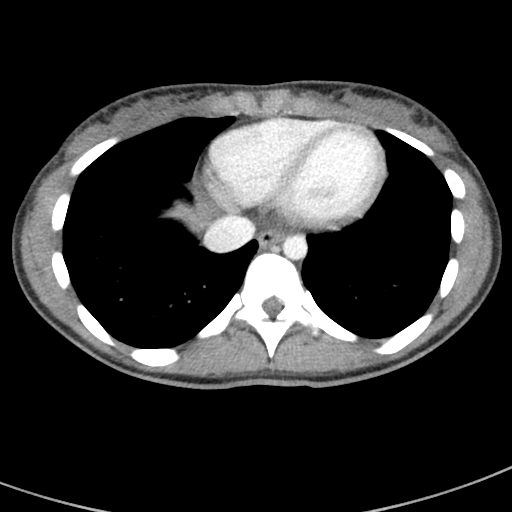

[Series 6: a/p w/ cor · coronal · 0.64mm/px · 3 of 91 slices shown]
[im 31/91  soft-tissue]
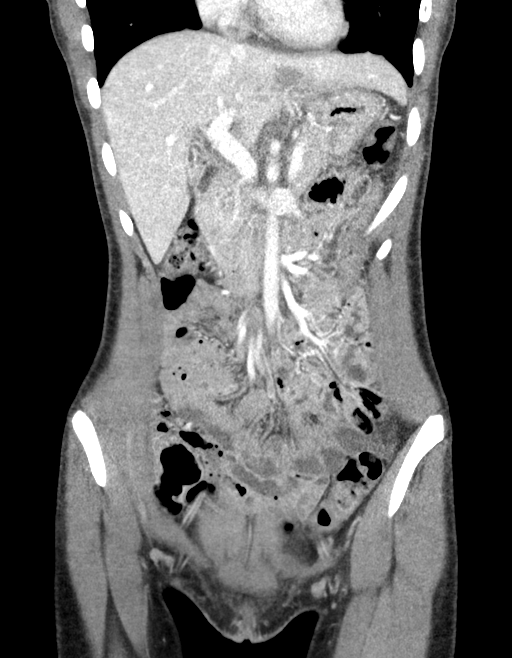
[im 41/91  soft-tissue]
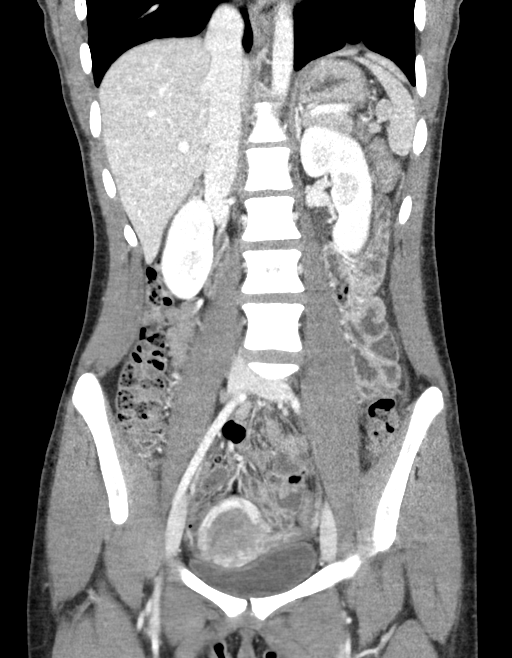
[im 51/91  soft-tissue]
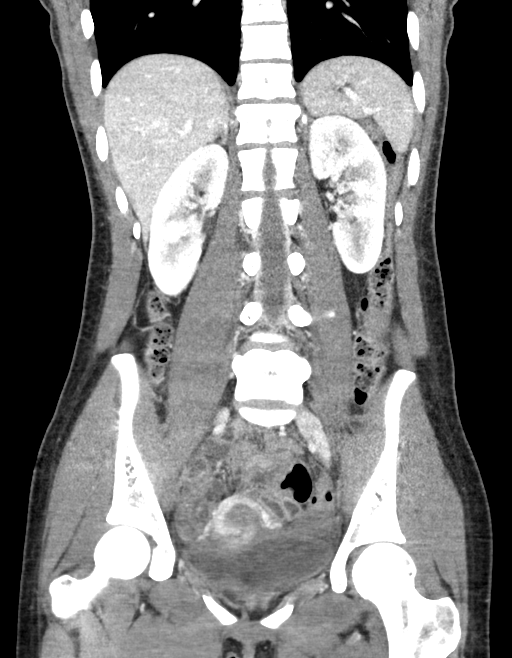

[16 of 46 positions shown; findings below may reference images not displayed]

FINDINGS: Lower chest: No acute pleural or parenchymal lung disease.

Hepatobiliary: No focal liver abnormality is seen. No gallstones,
gallbladder wall thickening, or biliary dilatation.

Pancreas: Unremarkable. No pancreatic ductal dilatation or
surrounding inflammatory changes.

Spleen: Normal in size without focal abnormality.

Adrenals/Urinary Tract: Adrenal glands are unremarkable. Kidneys are
normal, without renal calculi, focal lesion, or hydronephrosis.
Bladder is unremarkable.

Stomach/Bowel: No bowel obstruction or ileus. Normal gas-filled
appendix right lower quadrant. No bowel wall thickening or
inflammatory change.

Vascular/Lymphatic: Aortic atherosclerosis. No enlarged abdominal or
pelvic lymph nodes.

Reproductive: No adnexal masses.  No uterine masses.

Other: No free fluid or free gas.  No abdominal wall hernia.

Musculoskeletal: No acute or destructive bony lesions. Reconstructed
images demonstrate no additional findings.
IMPRESSION: 1. Normal appendix.
2. Otherwise no acute intra-abdominal or intrapelvic process.

## 2023-04-06 ENCOUNTER — Encounter: Payer: Self-pay | Admitting: *Deleted

## 2023-04-07 ENCOUNTER — Ambulatory Visit (INDEPENDENT_AMBULATORY_CARE_PROVIDER_SITE_OTHER): Payer: Self-pay

## 2023-04-07 ENCOUNTER — Ambulatory Visit (INDEPENDENT_AMBULATORY_CARE_PROVIDER_SITE_OTHER): Payer: Medicaid Other | Admitting: *Deleted

## 2023-04-07 ENCOUNTER — Other Ambulatory Visit (HOSPITAL_COMMUNITY)
Admission: RE | Admit: 2023-04-07 | Discharge: 2023-04-07 | Disposition: A | Discharge status: Home / Self Care | Payer: Medicaid Other | Source: Ambulatory Visit | Attending: Obstetrics & Gynecology | Admitting: Obstetrics & Gynecology

## 2023-04-07 VITALS — BP 100/66 | HR 94 | Wt 99.1 lb

## 2023-04-07 DIAGNOSIS — Z3401 Encounter for supervision of normal first pregnancy, first trimester: Secondary | ICD-10-CM

## 2023-04-07 DIAGNOSIS — Z1339 Encounter for screening examination for other mental health and behavioral disorders: Secondary | ICD-10-CM | POA: Diagnosis not present

## 2023-04-07 DIAGNOSIS — O3680X0 Pregnancy with inconclusive fetal viability, not applicable or unspecified: Secondary | ICD-10-CM

## 2023-04-07 DIAGNOSIS — Z3480 Encounter for supervision of other normal pregnancy, unspecified trimester: Secondary | ICD-10-CM

## 2023-04-07 DIAGNOSIS — Z3A08 8 weeks gestation of pregnancy: Secondary | ICD-10-CM

## 2023-04-07 MED ORDER — BLOOD PRESSURE KIT DEVI: 1.0000 | 0 refills | Status: DC

## 2023-04-07 NOTE — Progress Notes (Signed)
 New OB Intake  I connected with Tina Castaneda  on 04/07/23 at 10:15 AM EST by In Person Visit and verified that I am speaking with the correct person using two identifiers. Nurse is located at [facility] and pt is located at [place].  I discussed the limitations, risks, security and privacy concerns of performing an evaluation and management service by telephone and the availability of in person appointments. I also discussed with the patient that there may be a patient responsible charge related to this service. The patient expressed understanding and agreed to proceed.  I explained I am completing New OB Intake today. We discussed EDD of 11/14/2023, by Last Menstrual Period. Pt is G1P0000. I reviewed her allergies, medications and Medical/Surgical/OB history.    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. Discussed the nature of our practice with multiple providers including residents and students. Due to the size of the practice, the delivering provider may not be the same as those providing prenatal care.   Patient is not interested in water birth. Offered upcoming OB visit with CNM to discuss further.  MyChart/Babyscripts MyChart access verified. I explained pt will have some visits in office and some virtually. Babyscripts instructions given and order placed. Patient verifies receipt of registration text/e-mail. Account successfully created and app downloaded. If patient is a candidate for Optimized scheduling, add to sticky note.   Blood Pressure Cuff/Weight  Scale Blood pressure cuff ordered for patient to pick-up from [employer]. Explained after first prenatal appt pt will check weekly and document in Babyscripts. Patient does not have weight scale; patient may purchase if they desire to track weight weekly in Babyscripts.  Anatomy US  Explained first scheduled US  will be around 19 weeks. Anatomy US  scheduled for TBD at TBD.  Interested in [place]? If yes, send referral and doula dot phrase.   Is patient a candidate for Babyscripts Optimization? Yes, patient accepted    First visit review I reviewed new OB appt with patient. Explained pt will be seen by Dr. Starla at first visit. Discussed [place] genetic screening with patient. Requests Panorama and Horizon.. Routine prenatal labs  OB Urine and GC/CC only collected at today's visit. OB Panel and Natera deferred to New OB appt.    Last Pap No results found for: DIAGPAP  Tina CHRISTELLA Ober, RN 04/07/2023  10:58 AM

## 2023-04-07 NOTE — Patient Instructions (Signed)
 [facility] [employer] has a partnership with the [employer] to provide prenatal navigation for the most needed resources in our community. In order to see how we can help connect you to these resources we need consent to contact you. Please complete the very short consent using the link below:   English Link: https://[email or web address].tfaforms.[email or web address]?site=16  Spanish Link: https://[place].tfaforms.[email or web address]?site=16  Our practice his participating in a study that provides no-cost doula care. ACURE4Moms is a study looking at how doula care can reduce birthing disparities for Black and brown birthing people. We like to refer patients as soon as possible, but definitely before 28 weeks so patients can get to know their doula.    A doula is trained to provide support before, during and just after you give birth. While doulas do not provide medical care, they do provide emotional, physical and educational support. Doulas can help reduce your stress and comfort you and your partner. They can help you cope with labor by helping you use breathing techniques, massage, creative labor positioning, essential oils and affirmations.   ACURE4Moms is a research study trying to reduce:   low birthweight babies  emergency department visits & hospitalizations for birthing persons and their babies  depression among birthing people  discrimination in pregnancy-related care ACURE4Moms is trying out 2 programs designed by  people who have given birth. These programs include: 1. Sharing patient data and warning alerts with clinic staff to keep them accountable for their patients' outcomes and providing tools to help them  reduce bias in care. 2. Matching eligible patients with doulas from the  same community as the patients.  If you would like to participate in this study, please visit:   http://carroll-castaneda.info/  Options for Doula Care in the [place] Area  As you review  your birthing options, consider having a birth doula. A doula is trained to provide support before, during and just after you give birth. There are also postpartum doulas that help you adjust to new parenthood.  While doulas do not provide medical care, they do provide emotional, physical and educational support. A few months before your baby arrives, doulas can help answer questions, ease concerns and help you create and support your birthing plan.    Doulas can help reduce your stress and comfort you and your partner. They can help you cope with labor by helping you use breathing techniques, massage, creative labor positioning, essential oils and affirmations.   Studies show that the benefits of having a doula include:   A more positive birth experience  Fewer requests for pain-relief medication  Less likelihood of cesarean section, commonly called a c-section   Doulas are typically hired via a [employer] account planner between you and the doula. We are happy to provide a list of the most active doulas in the area, all of whom are credentialed by [facility] and will not count as a visitor at your birth.  There are several options for no-cost doula care at our hospital, including:  [facility] Volunteer Doula Program Every [employer] Program A Cure 4 Moms Doula Study (available only at [employer] for [facility], [place], [place] and [facility] offices)  For more information on these programs or to receive a list of doulas active in our area, please email [email or web address]

## 2023-04-08 LAB — CERVICOVAGINAL ANCILLARY ONLY
Chlamydia: NEGATIVE
Comment: NEGATIVE
Comment: NORMAL
Neisseria Gonorrhea: NEGATIVE

## 2023-04-09 LAB — CULTURE, OB URINE

## 2023-04-09 LAB — URINE CULTURE, OB REFLEX

## 2023-04-16 ENCOUNTER — Encounter: Payer: Self-pay | Admitting: Obstetrics & Gynecology

## 2023-04-22 ENCOUNTER — Inpatient Hospital Stay (HOSPITAL_COMMUNITY)
Admission: AD | Admit: 2023-04-22 | Discharge: 2023-04-23 | Disposition: A | Discharge status: Home / Self Care | Payer: Medicaid Other | Attending: Obstetrics and Gynecology | Admitting: Obstetrics and Gynecology

## 2023-04-22 DIAGNOSIS — S00511A Abrasion of lip, initial encounter: Secondary | ICD-10-CM | POA: Insufficient documentation

## 2023-04-22 DIAGNOSIS — X58XXXA Exposure to other specified factors, initial encounter: Secondary | ICD-10-CM | POA: Insufficient documentation

## 2023-04-22 DIAGNOSIS — O9A211 Injury, poisoning and certain other consequences of external causes complicating pregnancy, first trimester: Secondary | ICD-10-CM | POA: Insufficient documentation

## 2023-04-22 DIAGNOSIS — O26891 Other specified pregnancy related conditions, first trimester: Secondary | ICD-10-CM | POA: Insufficient documentation

## 2023-04-22 DIAGNOSIS — W03XXXA Other fall on same level due to collision with another person, initial encounter: Secondary | ICD-10-CM | POA: Insufficient documentation

## 2023-04-22 DIAGNOSIS — S70311A Abrasion, right thigh, initial encounter: Secondary | ICD-10-CM | POA: Insufficient documentation

## 2023-04-22 DIAGNOSIS — Z639 Problem related to primary support group, unspecified: Secondary | ICD-10-CM

## 2023-04-22 DIAGNOSIS — Z3A10 10 weeks gestation of pregnancy: Secondary | ICD-10-CM

## 2023-04-22 DIAGNOSIS — R109 Unspecified abdominal pain: Secondary | ICD-10-CM | POA: Insufficient documentation

## 2023-04-22 DIAGNOSIS — Z3480 Encounter for supervision of other normal pregnancy, unspecified trimester: Secondary | ICD-10-CM

## 2023-04-22 DIAGNOSIS — Z3A11 11 weeks gestation of pregnancy: Secondary | ICD-10-CM | POA: Insufficient documentation

## 2023-04-22 HISTORY — DX: Depression, unspecified: F32.A

## 2023-04-23 ENCOUNTER — Encounter (HOSPITAL_COMMUNITY): Payer: Self-pay | Admitting: Obstetrics and Gynecology

## 2023-04-23 ENCOUNTER — Inpatient Hospital Stay (HOSPITAL_COMMUNITY): Payer: Medicaid Other

## 2023-04-23 DIAGNOSIS — S70311A Abrasion, right thigh, initial encounter: Secondary | ICD-10-CM | POA: Diagnosis not present

## 2023-04-23 DIAGNOSIS — Z3A11 11 weeks gestation of pregnancy: Secondary | ICD-10-CM | POA: Diagnosis not present

## 2023-04-23 DIAGNOSIS — R109 Unspecified abdominal pain: Secondary | ICD-10-CM

## 2023-04-23 DIAGNOSIS — X58XXXA Exposure to other specified factors, initial encounter: Secondary | ICD-10-CM | POA: Diagnosis not present

## 2023-04-23 DIAGNOSIS — Z639 Problem related to primary support group, unspecified: Secondary | ICD-10-CM | POA: Diagnosis not present

## 2023-04-23 DIAGNOSIS — S00511A Abrasion of lip, initial encounter: Secondary | ICD-10-CM | POA: Diagnosis not present

## 2023-04-23 DIAGNOSIS — Z3A10 10 weeks gestation of pregnancy: Secondary | ICD-10-CM | POA: Diagnosis not present

## 2023-04-23 DIAGNOSIS — W03XXXA Other fall on same level due to collision with another person, initial encounter: Secondary | ICD-10-CM | POA: Diagnosis not present

## 2023-04-23 DIAGNOSIS — O9A211 Injury, poisoning and certain other consequences of external causes complicating pregnancy, first trimester: Secondary | ICD-10-CM | POA: Diagnosis present

## 2023-04-23 DIAGNOSIS — O26891 Other specified pregnancy related conditions, first trimester: Secondary | ICD-10-CM

## 2023-04-23 LAB — URINALYSIS, ROUTINE W REFLEX MICROSCOPIC
Bilirubin Urine: NEGATIVE
Glucose, UA: NEGATIVE mg/dL
Hgb urine dipstick: NEGATIVE
Ketones, ur: NEGATIVE mg/dL
Leukocytes,Ua: NEGATIVE
Nitrite: NEGATIVE
Protein, ur: 100 mg/dL — AB
Specific Gravity, Urine: 1.026 (ref 1.005–1.030)
pH: 6 (ref 5.0–8.0)

## 2023-04-23 MED ORDER — CYCLOBENZAPRINE HCL 10 MG PO TABS: 5.0000 mg | ORAL_TABLET | Freq: Once | ORAL | 0 refills | Status: AC

## 2023-04-23 MED ORDER — CYCLOBENZAPRINE HCL 5 MG PO TABS
10.0000 mg | ORAL_TABLET | Freq: Once | ORAL | Status: DC
  Filled 2023-04-23: qty 2
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
## 2023-04-23 NOTE — Progress Notes (Signed)
Lamont Snowball CNM in Family [facility] to discuss u/s results and d/c plan. Written and verbal d/c instructions given and understanding voiced.
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
## 2023-04-29 ENCOUNTER — Telehealth: Payer: Self-pay

## 2023-04-29 NOTE — Telephone Encounter (Signed)
 Pt called requested safe meds to take for allergies/colds in pregnancy. Sent safe med list in [place]

## 2023-05-03 ENCOUNTER — Encounter: Payer: Medicaid Other | Admitting: Physician Assistant

## 2023-05-11 ENCOUNTER — Encounter: Payer: Self-pay | Admitting: Obstetrics & Gynecology

## 2023-05-11 ENCOUNTER — Ambulatory Visit: Payer: Medicaid Other | Admitting: Obstetrics & Gynecology

## 2023-05-11 VITALS — BP 109/75 | HR 119 | Wt 96.4 lb

## 2023-05-11 DIAGNOSIS — O0932 Supervision of pregnancy with insufficient antenatal care, second trimester: Secondary | ICD-10-CM | POA: Diagnosis not present

## 2023-05-11 DIAGNOSIS — R636 Underweight: Secondary | ICD-10-CM

## 2023-05-11 DIAGNOSIS — O219 Vomiting of pregnancy, unspecified: Secondary | ICD-10-CM

## 2023-05-11 DIAGNOSIS — O99342 Other mental disorders complicating pregnancy, second trimester: Secondary | ICD-10-CM | POA: Diagnosis not present

## 2023-05-11 DIAGNOSIS — Z3A13 13 weeks gestation of pregnancy: Secondary | ICD-10-CM | POA: Diagnosis not present

## 2023-05-11 DIAGNOSIS — O0931 Supervision of pregnancy with insufficient antenatal care, first trimester: Secondary | ICD-10-CM

## 2023-05-11 DIAGNOSIS — F332 Major depressive disorder, recurrent severe without psychotic features: Secondary | ICD-10-CM

## 2023-05-11 DIAGNOSIS — Z3400 Encounter for supervision of normal first pregnancy, unspecified trimester: Secondary | ICD-10-CM

## 2023-05-11 MED ORDER — ONDANSETRON HCL 4 MG PO TABS: 4.0000 mg | ORAL_TABLET | Freq: Three times a day (TID) | ORAL | 1 refills | Status: DC | PRN

## 2023-05-11 NOTE — Progress Notes (Signed)
  Subjective:    Tina Castaneda is a G1P0000 [redacted]w[redacted]d being seen today for her first obstetrical visit.  Her obstetrical history is significant for  underweight and vomitting . She has tried Unisom and Vit B6 but says that it caused too much vomitting if she missed a pill. Her TSH was recently normal. Pregnancy history fully reviewed.  Patient reports vomiting.  Vitals:   05/11/23 1437  BP: 109/75  Pulse: (!) 119  Weight: 96 lb 6.4 oz (43.7 kg)    HISTORY: OB History  Gravida Para Term Preterm AB Living  1 0 0 0 0 0  SAB IAB Ectopic Multiple Live Births  0 0 0 0 0    # Outcome Date GA Lbr Len/2nd Weight Sex Type Anes PTL Lv  1 Gravida            Past Medical History:  Diagnosis Date   Anemia    Anxiety    Asthma    Depression    Past Surgical History:  Procedure Laterality Date   WISDOM TOOTH EXTRACTION     Family History  Problem Relation Age of Onset   Asthma Sister    Asthma Maternal Grandmother      Exam    Uterus:   13 week size FHR- 160                                      Skin: normal coloration and turgor, no rashes    Neurologic: oriented   Extremities: normal strength, tone, and muscle mass   HEENT PERRLA   Mouth/Teeth mucous membranes moist, pharynx normal without lesions   Neck supple   Cardiovascular: regular rate and rhythm   Respiratory:  appears well, vitals normal, no respiratory distress, acyanotic, normal RR, ear and throat exam is normal, neck free of mass or lymphadenopathy, chest clear, no wheezing, crepitations, rhonchi, normal symmetric air entry   Abdomen: soft, non-tender; bowel sounds normal; no masses,  no organomegaly          Assessment:    Pregnancy: G1P0000 Patient Active Problem List   Diagnosis Date Noted   Vomiting complicating pregnancy 05/11/2023   Underweight 05/11/2023   Encounter for supervision of normal pregnancy in teen primigravida, antepartum 05/11/2023   Tylenol overdose 10/27/2020    Suicide attempt (HCC)    Severe recurrent major depression without psychotic features (HCC) 08/28/2019   Suicide ideation 02/16/2018   MDD (major depressive disorder), severe (HCC) 02/16/2018   At risk for tuberculosis 10/30/2016        Plan:     Initial labs drawn. Prenatal vitamins. Problem list reviewed and updated. Genetic Screening discussed : requested.  Ultrasound discussed; fetal survey: already scheduled  Follow up in 4 weeks. Zofran prescribed Rec around 40 pounds total weight gain  Tina Castaneda 05/11/2023

## 2023-05-11 NOTE — Progress Notes (Signed)
 Throwing up all of the time. Difficult to hold down liquids.

## 2023-05-12 LAB — RPR: RPR Ser Ql: NONREACTIVE

## 2023-05-12 LAB — HIV ANTIBODY (ROUTINE TESTING W REFLEX): HIV Screen 4th Generation wRfx: NONREACTIVE

## 2023-05-16 LAB — PANORAMA PRENATAL TEST FULL PANEL:PANORAMA TEST PLUS 5 ADDITIONAL MICRODELETIONS: FETAL FRACTION: 22

## 2023-05-26 LAB — HORIZON CUSTOM: REPORT SUMMARY: NEGATIVE

## 2023-06-06 ENCOUNTER — Inpatient Hospital Stay (HOSPITAL_COMMUNITY)
Admission: AD | Admit: 2023-06-06 | Discharge: 2023-06-06 | Disposition: A | Discharge status: Home / Self Care | Attending: Obstetrics & Gynecology | Admitting: Obstetrics & Gynecology

## 2023-06-06 ENCOUNTER — Encounter (HOSPITAL_COMMUNITY): Payer: Self-pay

## 2023-06-06 DIAGNOSIS — Z3A17 17 weeks gestation of pregnancy: Secondary | ICD-10-CM | POA: Insufficient documentation

## 2023-06-06 DIAGNOSIS — O99891 Other specified diseases and conditions complicating pregnancy: Secondary | ICD-10-CM | POA: Diagnosis not present

## 2023-06-06 DIAGNOSIS — O26852 Spotting complicating pregnancy, second trimester: Secondary | ICD-10-CM | POA: Insufficient documentation

## 2023-06-06 DIAGNOSIS — Z3A20 20 weeks gestation of pregnancy: Secondary | ICD-10-CM | POA: Diagnosis not present

## 2023-06-06 DIAGNOSIS — O209 Hemorrhage in early pregnancy, unspecified: Secondary | ICD-10-CM | POA: Diagnosis present

## 2023-06-06 DIAGNOSIS — R1030 Lower abdominal pain, unspecified: Secondary | ICD-10-CM | POA: Diagnosis not present

## 2023-06-06 DIAGNOSIS — N939 Abnormal uterine and vaginal bleeding, unspecified: Secondary | ICD-10-CM

## 2023-06-06 LAB — URINALYSIS, ROUTINE W REFLEX MICROSCOPIC
Bilirubin Urine: NEGATIVE
Glucose, UA: NEGATIVE mg/dL
Hgb urine dipstick: NEGATIVE
Ketones, ur: NEGATIVE mg/dL
Leukocytes,Ua: NEGATIVE
Nitrite: NEGATIVE
Protein, ur: NEGATIVE mg/dL
Specific Gravity, Urine: 1.016 (ref 1.005–1.030)
pH: 8 (ref 5.0–8.0)

## 2023-06-06 LAB — WET PREP, GENITAL
Clue Cells Wet Prep HPF POC: NONE SEEN
Sperm: NONE SEEN
Trich, Wet Prep: NONE SEEN
WBC, Wet Prep HPF POC: <10 (ref <10)
Yeast Wet Prep HPF POC: NONE SEEN

## 2023-06-06 MED ORDER — ACETAMINOPHEN 325 MG PO TABS
650.0000 mg | ORAL_TABLET | Freq: Once | ORAL | Status: AC
  Administered 2023-06-06: 650 mg via ORAL
  Filled 2023-06-06: qty 2

## 2023-06-06 NOTE — MAU Provider Note (Signed)
 History     CSN: [id number]  Arrival date and time: 06/06/23 0121   Event Date/Time   First Provider Initiated Contact with Patient 06/06/23 0221      Chief Complaint  Patient presents with   Abdominal Pain   Vaginal Bleeding   Tina Castaneda is a 18 y.o. G2P0 at [redacted]w[redacted]d who receives care at [facility].  She presents today for vaginal spotting and abdominal cramping. Patient states she was having "pinkish brownish" spotting that occurred around 11am with onset of cramping and pressure.  She reports the pain is intermittent and is improved with walking, but has no worsening factors.  She rates the pain a 6/10 and states the pressure is her biggest concern. She denies current spotting or bleeding.  No abnormal discharge prior to the spotting. No issues with urination, constipation, or diarrhea. No recent sexual activity.     OB History     Gravida  2   Para  0   Term  0   Preterm  0   AB  0   Living  0      SAB  0   IAB  0   Ectopic  0   Multiple  0   Live Births  0           Past Medical History:  Diagnosis Date   Anemia    Anxiety    Asthma    Depression     Past Surgical History:  Procedure Laterality Date   WISDOM TOOTH EXTRACTION      Family History  Problem Relation Age of Onset   Asthma Sister    Asthma Maternal Grandmother     Social History   Tobacco Use   Smoking status: Never    Passive exposure: Never   Smokeless tobacco: Never  Vaping Use   Vaping status: Never Used  Substance Use Topics   Alcohol use: Not Currently   Drug use: Not Currently    Types: Marijuana    Allergies: No Known Allergies  Medications Prior to Admission  Medication Sig Dispense Refill Last Dose/Taking   Blood Pressure Monitoring (BLOOD PRESSURE KIT) DEVI 1 Device by Does not apply route once a week. (Patient not taking: Reported on 05/11/2023) 1 each 0    doxylamine, Sleep, (UNISOM) 25 MG tablet Take 25 mg by mouth at bedtime as  needed. (Patient not taking: Reported on 05/11/2023)      ondansetron (ZOFRAN) 4 MG tablet Take 1 tablet (4 mg total) by mouth every 8 (eight) hours as needed for nausea or vomiting. 40 tablet 1    Prenatal Vit-Fe Phos-FA-Omega (VITAFOL GUMMIES) 3.33-0.333-34.8 MG CHEW Chew 3 tablets by mouth daily.      pyridOXINE (VITAMIN B6) 100 MG tablet Take 100 mg by mouth daily.       Review of Systems  Gastrointestinal:  Positive for abdominal pain, nausea and vomiting (1130pm last incident). Negative for constipation and diarrhea.  Genitourinary:  Positive for vaginal bleeding (spotting). Negative for difficulty urinating, dysuria and vaginal discharge.   Physical Exam   Blood pressure 113/66, pulse 82, temperature 98.7 F (37.1 C), temperature source Oral, resp. rate 15, height 5\' 3"  (1.6 m), weight 46.8 kg, last menstrual period 02/07/2023, SpO2 99%, unknown if currently breastfeeding.  Physical Exam Vitals and nursing note reviewed. Exam conducted with a chaperone present Darlina Rumpf, RN).  Constitutional:      Appearance: Normal appearance. She is well-developed.  HENT:     Head: Normocephalic  and atraumatic.  Eyes:     Conjunctiva/sclera: Conjunctivae normal.  Cardiovascular:     Rate and Rhythm: Normal rate.  Pulmonary:     Effort: Pulmonary effort is normal. No respiratory distress.  Abdominal:     Tenderness: There is abdominal tenderness in the suprapubic area.  Genitourinary:    General: Normal vulva.     Uterus: Enlarged.      Comments: Wet prep collected blindly.  Musculoskeletal:        General: Normal range of motion.     Cervical back: Normal range of motion.  Skin:    General: Skin is warm and dry.  Neurological:     Mental Status: She is alert and oriented to person, place, and time.  Psychiatric:        Mood and Affect: Mood normal.        Behavior: Behavior normal.     MAU Course  Procedures Results for orders placed or performed during the hospital encounter of  06/06/23 (from the past 24 hours)  Urinalysis, Routine w reflex microscopic -Urine, Clean Catch     Status: Abnormal   Collection Time: 06/06/23  1:36 AM  Result Value Ref Range   Color, Urine YELLOW YELLOW   APPearance CLOUDY (A) CLEAR   Specific Gravity, Urine 1.016 1.005 - 1.030   pH 8.0 5.0 - 8.0   Glucose, UA NEGATIVE NEGATIVE mg/dL   Hgb urine dipstick NEGATIVE NEGATIVE   Bilirubin Urine NEGATIVE NEGATIVE   Ketones, ur NEGATIVE NEGATIVE mg/dL   Protein, ur NEGATIVE NEGATIVE mg/dL   Nitrite NEGATIVE NEGATIVE   Leukocytes,Ua NEGATIVE NEGATIVE    MDM Wet prep UA Pain Medication Assessment and Plan  18 year old, G2P0  SIUP at 83 weeks Abdominal Cramping & Pressure Vaginal Spotting  -Reviewed POC with patient. -patient states she has never had pelvic exam before and would like to defer if possible.  -Discussed blind swab for infections and patient agreeable.  -Patient declines testing for GC/CT.  -Exam performed and findings discussed.  -Reassured that no active bleeding or even apparent discharge noted.  -Patient offered and accepts pain medication. -Will send wet prep and await results.   Cherre Robins 06/06/2023, 2:21 AM   Reassessment (2:43 AM) -Patient requesting results be sent via mychart. -Provider agreeable. -Nurse to give precautions. -Discharged to home in stable condition.   Addendum (4:43 AM) -Results as above. -Mychart message sent notifying patient.  Cherre Robins MSN, CNM Advanced Practice Provider, Center for [employer]

## 2023-06-06 NOTE — MAU Note (Signed)
..  Tina Castaneda is a 18 y.o. at 17w  here in MAU reporting: spotting that began tonight that is when she wipes and is having lower abdominal cramping that began a few days ago. Has not taken anything for the pain.  Last intercourse: a few weeks ago  LMP: 02/07/2024   Pain score: 6/10 Vitals:   06/06/23 0139  BP: 113/66  Pulse: 82  Resp: 15  Temp: 98.7 F (37.1 C)  SpO2: 99%     FHT:139 Lab orders placed from triage: UA

## 2023-06-08 ENCOUNTER — Ambulatory Visit (INDEPENDENT_AMBULATORY_CARE_PROVIDER_SITE_OTHER): Admitting: Obstetrics and Gynecology

## 2023-06-08 VITALS — BP 112/68 | HR 108 | Wt 101.0 lb

## 2023-06-08 DIAGNOSIS — F32A Depression, unspecified: Secondary | ICD-10-CM

## 2023-06-08 DIAGNOSIS — O99340 Other mental disorders complicating pregnancy, unspecified trimester: Secondary | ICD-10-CM

## 2023-06-08 DIAGNOSIS — O0990 Supervision of high risk pregnancy, unspecified, unspecified trimester: Secondary | ICD-10-CM | POA: Diagnosis not present

## 2023-06-08 DIAGNOSIS — Z3400 Encounter for supervision of normal first pregnancy, unspecified trimester: Secondary | ICD-10-CM

## 2023-06-08 DIAGNOSIS — Z3A17 17 weeks gestation of pregnancy: Secondary | ICD-10-CM

## 2023-06-08 MED ORDER — VITAFOL GUMMIES 3.33-0.333-34.8 MG PO CHEW: 3.0000 | CHEWABLE_TABLET | Freq: Every day | ORAL | 11 refills | Status: DC

## 2023-06-08 NOTE — Progress Notes (Signed)
 Pt seen in MAU this week for pressure/ spotting.  Pt denies any bleeding today.  Pt did have recent intercourse prior to spotting.

## 2023-06-08 NOTE — Progress Notes (Signed)
   PRENATAL VISIT NOTE  Subjective:  Tina Castaneda is a 18 y.o. G2P0000 at [redacted]w[redacted]d being seen today for ongoing prenatal care.  She is currently monitored for the following issues for this high-risk pregnancy and has At risk for tuberculosis; Suicide ideation; Severe recurrent major depression without psychotic features (HCC); Tylenol overdose; Suicide attempt [facility]); MDD (major depressive disorder), severe (HCC); Vomiting complicating pregnancy; Underweight; and Encounter for supervision of normal pregnancy in teen primigravida, antepartum on their problem list.  Patient reports spotting after intercourse. Initially was red, seen in MAU with negative workup on 4/6. Now describes it as light brown/pink, lightening up. Had some pressure yesterday but improved today. Contractions: Irritability. Vag. Bleeding: Scant, None.  Movement: Present. Denies leaking of fluid.   Describes mood as good. Not taking any medications, wasn't taking meds before pregnancy. Not interested in behavioral health consult.  The following portions of the patient's history were reviewed and updated as appropriate: allergies, current medications, past family history, past medical history, past social history, past surgical history and problem list.   Objective:   Vitals:   06/08/23 0947  BP: 112/68  Pulse: (!) 108  Weight: 101 lb (45.8 kg)   Body mass index is 17.89 kg/m. Total weight gain: 7 lb (3.175 kg)   Fetal Status: Fetal Heart Rate (bpm): 140   Movement: Present     General:  Alert, oriented and cooperative. Patient is in no acute distress.  Skin: Skin is warm and dry. No rash noted.   Cardiovascular: Normal heart rate noted  Respiratory: Normal respiratory effort, no problems with respiration noted  Abdomen: Soft, gravid, appropriate for gestational age.  Pain/Pressure: Absent     Pelvic: Cervical exam deferred        Extremities: Normal range of motion.     Mental Status: Normal mood and  affect. Normal behavior. Normal judgment and thought content.   Assessment and Plan:  Pregnancy: G2P0000 at [redacted]w[redacted]d 1. Supervision of high risk pregnancy, antepartum (Primary) Needs OB panel, never drawn, will do today with AFP - AFP, Serum, Open Spina Bifida - Prenatal Vit-Fe Phos-FA-Omega (VITAFOL GUMMIES) 3.33-0.333-34.8 MG CHEW; Chew 3 tablets by mouth daily.  Dispense: 90 tablet; Refill: 11 - CBC/D/Plt+RPR+Rh+ABO+RubIgG... - HgB A1c  2. Depression affecting pregnancy Declines behavioral health, discussed option of this if she changes her mind  3. [redacted] weeks gestation of pregnancy Anatomy ultrasound scheduled for 4/23 - AFP, Serum, Open Spina Bifida   Preterm labor symptoms and general obstetric precautions including but not limited to vaginal bleeding, contractions, leaking of fluid and fetal movement were reviewed in detail with the patient. Please refer to After Visit Summary for other counseling recommendations.   Return in about 4 weeks (around 07/06/2023).  Future Appointments  Date Time Provider Department Center  06/23/2023  8:15 AM [facility] NURSE [facility]  06/23/2023  8:30 AM [facility] US3 [facility]    Wanita Chamberlain, MD, FACOG Obstetrician & Gynecologist, [facility] for [facility], [facility]

## 2023-06-09 ENCOUNTER — Encounter: Payer: Self-pay | Admitting: Obstetrics and Gynecology

## 2023-06-09 LAB — HCV INTERPRETATION

## 2023-06-09 LAB — HEMOGLOBIN A1C
Est. average glucose Bld gHb Est-mCnc: 103 mg/dL
Hgb A1c MFr Bld: 5.2 % (ref 4.8–5.6)

## 2023-06-09 LAB — CBC/D/PLT+RPR+RH+ABO+RUBIGG...
Antibody Screen: NEGATIVE
Basophils Absolute: 0 10*3/uL (ref 0.0–0.2)
Basos: 1 %
EOS (ABSOLUTE): 0.1 10*3/uL (ref 0.0–0.4)
Eos: 1 %
HCV Ab: NONREACTIVE
HIV Screen 4th Generation wRfx: NONREACTIVE
Hematocrit: 34.8 % (ref 34.0–46.6)
Hemoglobin: 11 g/dL — ABNORMAL LOW (ref 11.1–15.9)
Hepatitis B Surface Ag: NEGATIVE
Immature Grans (Abs): 0 10*3/uL (ref 0.0–0.1)
Immature Granulocytes: 0 %
Lymphocytes Absolute: 2 10*3/uL (ref 0.7–3.1)
Lymphs: 25 %
MCH: 29.7 pg (ref 26.6–33.0)
MCHC: 31.6 g/dL (ref 31.5–35.7)
MCV: 94 fL (ref 79–97)
Monocytes Absolute: 0.4 10*3/uL (ref 0.1–0.9)
Monocytes: 6 %
Neutrophils Absolute: 5.3 10*3/uL (ref 1.4–7.0)
Neutrophils: 67 %
Platelets: 263 10*3/uL (ref 150–450)
RBC: 3.7 x10E6/uL — ABNORMAL LOW (ref 3.77–5.28)
RDW: 13.1 % (ref 11.7–15.4)
RPR Ser Ql: NONREACTIVE
Rh Factor: POSITIVE
Rubella Antibodies, IGG: 3.56 {index} (ref >0.99)
WBC: 7.9 10*3/uL (ref 3.4–10.8)

## 2023-06-11 LAB — AFP, SERUM, OPEN SPINA BIFIDA
AFP MoM: 1.87
AFP Value: 106.6 ng/mL
Gest. Age on Collection Date: 17.3 wk
Maternal Age At EDD: 21.2 a
OSBR Risk 1 IN: 2166
Test Results:: NEGATIVE
Weight: 101 [lb_av]

## 2023-06-21 ENCOUNTER — Ambulatory Visit
Admission: EM | Admit: 2023-06-21 | Discharge: 2023-06-21 | Disposition: A | Discharge status: Home / Self Care | Attending: Family Medicine | Admitting: Family Medicine

## 2023-06-21 DIAGNOSIS — M5432 Sciatica, left side: Secondary | ICD-10-CM

## 2023-06-21 DIAGNOSIS — Z3A19 19 weeks gestation of pregnancy: Secondary | ICD-10-CM | POA: Diagnosis not present

## 2023-06-21 MED ORDER — ACETAMINOPHEN 325 MG PO TABS: 650.0000 mg | ORAL_TABLET | Freq: Four times a day (QID) | ORAL | 0 refills | Status: DC | PRN

## 2023-06-21 NOTE — ED Triage Notes (Signed)
 Patient is [redacted] weeks pregnant c/o left side leg pain that started a couple days ago.

## 2023-06-21 NOTE — ED Provider Notes (Signed)
 [facility]  Note:  This document was prepared using Conservation officer, historic buildings and may include unintentional dictation errors.  MRN: [id number] DOB: Apr 28, 2005  Subjective:   Tina Castaneda is a 18 y.o. female [redacted] weeks pregnant presenting for 2-day history of moderate low back pain that radiates to the left thigh and lower leg.  No fall, trauma, numbness or tingling, saddle paresthesia, changes to bowel or urinary habits.  Has not use any medications for relief.  No current facility-administered medications for this encounter.  Current Outpatient Medications:    Blood Pressure Monitoring (BLOOD PRESSURE KIT) DEVI, 1 Device by Does not apply route once a week. (Patient not taking: Reported on 05/11/2023), Disp: 1 each, Rfl: 0   ondansetron  (ZOFRAN ) 4 MG tablet, Take 1 tablet (4 mg total) by mouth every 8 (eight) hours as needed for nausea or vomiting., Disp: 40 tablet, Rfl: 1   Prenatal Vit-Fe Phos-FA-Omega (VITAFOL  GUMMIES) 3.33-0.333-34.8 MG CHEW, Chew 3 tablets by mouth daily., Disp: 90 tablet, Rfl: 11   pyridOXINE (VITAMIN B6) 100 MG tablet, Take 100 mg by mouth daily., Disp: , Rfl:    No Known Allergies  Past Medical History:  Diagnosis Date   Anemia    Anxiety    Asthma    Depression      Past Surgical History:  Procedure Laterality Date   WISDOM TOOTH EXTRACTION      Family History  Problem Relation Age of Onset   Asthma Sister    Asthma Maternal Grandmother     Social History   Tobacco Use   Smoking status: Never    Passive exposure: Never   Smokeless tobacco: Never  Vaping Use   Vaping status: Never Used  Substance Use Topics   Alcohol use: Not Currently   Drug use: Not Currently    Types: Marijuana    ROS   Objective:   Vitals: BP 110/65 (BP Location: Right Arm)   Pulse (!) 105   Temp 98.5 F (36.9 C) (Oral)   Resp 20   LMP 02/07/2023 (Exact Date)   SpO2 96%   Physical Exam Constitutional:       General: She is not in acute distress.    Appearance: Normal appearance. She is well-developed. She is not ill-appearing, toxic-appearing or diaphoretic.  HENT:     Head: Normocephalic and atraumatic.     Nose: Nose normal.     Mouth/Throat:     Mouth: Mucous membranes are moist.  Eyes:     General: No scleral icterus.       Right eye: No discharge.        Left eye: No discharge.     Extraocular Movements: Extraocular movements intact.  Cardiovascular:     Rate and Rhythm: Normal rate.  Pulmonary:     Effort: Pulmonary effort is normal.  Musculoskeletal:     Lumbar back: No swelling, edema, deformity, signs of trauma, lacerations, spasms, tenderness or bony tenderness. Decreased range of motion. Positive left straight leg raise test. Negative right straight leg raise test. No scoliosis.     Comments: Favors the left side of the hip, buttock and low back.  Skin:    General: Skin is warm and dry.  Neurological:     General: No focal deficit present.     Mental Status: She is alert and oriented to person, place, and time.  Psychiatric:        Mood and Affect: Mood normal.  Behavior: Behavior normal.     Assessment and Plan :   PDMP not reviewed this encounter.  1. Sciatica of left side   2. [redacted] weeks gestation of pregnancy    Discussed back care, start a trial of Tylenol .  Patient would likely benefit from steroids but would prefer she clear this with her OB/GYN and/or spine specialty practice.  Counseled patient on potential for adverse effects with medications prescribed/recommended today, ER and return-to-clinic precautions discussed, patient verbalized understanding.    Adolph Hoop, [place] 06/21/23 1342

## 2023-06-23 ENCOUNTER — Ambulatory Visit: Payer: Medicaid Other | Admitting: *Deleted

## 2023-06-23 ENCOUNTER — Other Ambulatory Visit: Payer: Self-pay | Admitting: Obstetrics & Gynecology

## 2023-06-23 ENCOUNTER — Ambulatory Visit: Attending: Obstetrics and Gynecology | Admitting: Obstetrics and Gynecology

## 2023-06-23 ENCOUNTER — Other Ambulatory Visit: Payer: Self-pay | Admitting: *Deleted

## 2023-06-23 ENCOUNTER — Encounter: Payer: Self-pay | Admitting: *Deleted

## 2023-06-23 ENCOUNTER — Ambulatory Visit: Payer: Medicaid Other | Attending: Obstetrics & Gynecology

## 2023-06-23 VITALS — BP 104/62 | HR 84

## 2023-06-23 DIAGNOSIS — Z363 Encounter for antenatal screening for malformations: Secondary | ICD-10-CM | POA: Insufficient documentation

## 2023-06-23 DIAGNOSIS — Z3689 Encounter for other specified antenatal screening: Secondary | ICD-10-CM | POA: Diagnosis present

## 2023-06-23 DIAGNOSIS — Z3480 Encounter for supervision of other normal pregnancy, unspecified trimester: Secondary | ICD-10-CM | POA: Diagnosis present

## 2023-06-23 DIAGNOSIS — R636 Underweight: Secondary | ICD-10-CM

## 2023-06-23 DIAGNOSIS — O2612 Low weight gain in pregnancy, second trimester: Secondary | ICD-10-CM | POA: Insufficient documentation

## 2023-06-23 DIAGNOSIS — Z3A19 19 weeks gestation of pregnancy: Secondary | ICD-10-CM | POA: Insufficient documentation

## 2023-06-23 DIAGNOSIS — D649 Anemia, unspecified: Secondary | ICD-10-CM

## 2023-06-23 DIAGNOSIS — O99012 Anemia complicating pregnancy, second trimester: Secondary | ICD-10-CM | POA: Diagnosis not present

## 2023-06-23 DIAGNOSIS — O99212 Obesity complicating pregnancy, second trimester: Secondary | ICD-10-CM

## 2023-06-23 DIAGNOSIS — O3680X0 Pregnancy with inconclusive fetal viability, not applicable or unspecified: Secondary | ICD-10-CM

## 2023-06-23 DIAGNOSIS — Z0375 Encounter for suspected cervical shortening ruled out: Secondary | ICD-10-CM

## 2023-06-23 NOTE — Progress Notes (Signed)
  Maternal-Fetal Medicine Consultation Name: Tina Castaneda MRN: [id number]  G2 P0010. Patient is here for fetal anatomy scan. On cell-free fetal DNA screening, the risks of aneuploidies are not increased. MSAFP screening showed low risk for open-neural tube defects. Obstetric history is significant for an early termination of pregnancy.  Ultrasound We performed fetal anatomy scan. No makers of aneuploidies or fetal structural defects are seen. Fetal biometry is consistent with her previously-established dates. Amniotic fluid is normal and good fetal activity is seen. Patient understands the limitations of ultrasound in detecting fetal anomalies. Because of the appearance of cervical shortening, we performed a transvaginal ultrasound to evaluate the cervix.  The cervix measures between 2.8 to 3.5 cm, which is normal.  Patient's pregravid BMI is 16.6.  I counseled the patient that small mothers are more likely to have small for gestational age fetus that could be difficult to differentiate from fetal growth restriction. I recommended a fetal growth assessment in 9 weeks. I reassured her of normal cervical length measurement and that history of termination is unlikely to increase the risk of miscarriage.  Recommendations -An appointment was made for her to return in 9 weeks for fetal growth assessment.   Consultation including face-to-face (more than 50%) counseling 15 minutes.

## 2023-06-29 ENCOUNTER — Other Ambulatory Visit (HOSPITAL_COMMUNITY)
Admission: RE | Admit: 2023-06-29 | Discharge: 2023-06-29 | Disposition: A | Discharge status: Home / Self Care | Source: Ambulatory Visit | Attending: Advanced Practice Midwife | Admitting: Advanced Practice Midwife

## 2023-06-29 ENCOUNTER — Ambulatory Visit (INDEPENDENT_AMBULATORY_CARE_PROVIDER_SITE_OTHER)

## 2023-06-29 VITALS — BP 111/83 | HR 79 | Wt 105.0 lb

## 2023-06-29 DIAGNOSIS — N898 Other specified noninflammatory disorders of vagina: Secondary | ICD-10-CM

## 2023-06-29 NOTE — Progress Notes (Signed)
 SUBJECTIVE:  18 y.o. female complains of vaginal itching and irritation for 1 week. States it may be the soap she uses.  Denies abnormal vaginal bleeding or significant pelvic pain or fever. No UTI symptoms. Denies history of known exposure to STD.  Patient's last menstrual period was 02/07/2023 (exact date). Pt currently [redacted]w[redacted]d. FHR 141 today. Movement present. Pt does report cramping, tightness in abdomen, irregular at the moment but does rate as 8/10. Pt states she could be better at drinking her water. Encouraged pt to go home, lay down on her side, drink plenty of fluids and see if this resolves cramping she is feeling. If it does not after an hour and pain is increasing in intensity/frequency pt to report to MAU. Voiced understanding.  OBJECTIVE:  She appears well, afebrile. Urine dipstick: not done.  ASSESSMENT:  Vaginal Discharge  Vaginal Itching   PLAN:  GC, chlamydia, trichomonas, BVAG, CVAG probe sent to lab. Treatment: To be determined once lab results are received ROV prn if symptoms persist or worsen.

## 2023-06-30 ENCOUNTER — Telehealth: Payer: Self-pay

## 2023-06-30 LAB — CERVICOVAGINAL ANCILLARY ONLY
Bacterial Vaginitis (gardnerella): POSITIVE — AB
Candida Glabrata: NEGATIVE
Candida Vaginitis: POSITIVE — AB
Chlamydia: NEGATIVE
Comment: NEGATIVE
Comment: NEGATIVE
Comment: NEGATIVE
Comment: NEGATIVE
Comment: NEGATIVE
Comment: NORMAL
Neisseria Gonorrhea: NEGATIVE
Trichomonas: NEGATIVE

## 2023-06-30 NOTE — Telephone Encounter (Signed)
 Returned call and advised that results for vaginal swab are not back.

## 2023-07-01 ENCOUNTER — Telehealth: Payer: Self-pay

## 2023-07-01 ENCOUNTER — Other Ambulatory Visit: Payer: Self-pay

## 2023-07-01 MED ORDER — TERCONAZOLE 0.8 % VA CREA: 1.0000 | TOPICAL_CREAM | Freq: Every day | VAGINAL | 0 refills | Status: DC

## 2023-07-01 MED ORDER — METRONIDAZOLE 0.75 % VA GEL: 1.0000 | Freq: Every day | VAGINAL | 0 refills | Status: AC

## 2023-07-01 MED ORDER — METRONIDAZOLE 500 MG PO TABS
500.0000 mg | ORAL_TABLET | Freq: Two times a day (BID) | ORAL | 0 refills | Status: DC
Start: 2023-07-01 — End: 2023-07-01

## 2023-07-01 NOTE — Telephone Encounter (Signed)
 S/w pt who requested flagyl  be changed to metrogel , and answered rx questions.

## 2023-07-06 ENCOUNTER — Ambulatory Visit (INDEPENDENT_AMBULATORY_CARE_PROVIDER_SITE_OTHER): Admitting: Advanced Practice Midwife

## 2023-07-06 ENCOUNTER — Encounter: Payer: Self-pay | Admitting: Advanced Practice Midwife

## 2023-07-06 VITALS — BP 105/65 | HR 81 | Wt 108.2 lb

## 2023-07-06 DIAGNOSIS — Z3A21 21 weeks gestation of pregnancy: Secondary | ICD-10-CM

## 2023-07-06 DIAGNOSIS — O99340 Other mental disorders complicating pregnancy, unspecified trimester: Secondary | ICD-10-CM | POA: Diagnosis not present

## 2023-07-06 DIAGNOSIS — F32A Depression, unspecified: Secondary | ICD-10-CM | POA: Diagnosis not present

## 2023-07-06 DIAGNOSIS — Z3490 Encounter for supervision of normal pregnancy, unspecified, unspecified trimester: Secondary | ICD-10-CM

## 2023-07-06 NOTE — Progress Notes (Signed)
 Currently taking meds for bv.  Asking about order for bp cuff, would like to discuss with provider

## 2023-07-06 NOTE — Progress Notes (Signed)
   PRENATAL VISIT NOTE  Subjective:  Tina Castaneda is a 18 y.o. G2P0010 at [redacted]w[redacted]d being seen today for ongoing prenatal care.  She is currently monitored for the following issues for this low-risk pregnancy and has At risk for tuberculosis; Suicide ideation; Severe recurrent major depression without psychotic features (HCC); Tylenol  overdose; Suicide attempt [facility]); MDD (major depressive disorder), severe (HCC); Vomiting complicating pregnancy; Underweight; and Encounter for supervision of normal pregnancy in teen primigravida, antepartum on their problem list.  Patient reports no complaints.  Contractions: Not present. Vag. Bleeding: None.  Movement: Increased. Denies leaking of fluid.   The following portions of the patient's history were reviewed and updated as appropriate: allergies, current medications, past family history, past medical history, past social history, past surgical history and problem list.   Objective:   Vitals:   07/06/23 1041  BP: 105/65  Pulse: 81  Weight: 108 lb 3.2 oz (49.1 kg)    Fetal Status: Fetal Heart Rate (bpm): 144 Fundal Height: 21 cm Movement: Increased     General:  Alert, oriented and cooperative. Patient is in no acute distress.  Skin: Skin is warm and dry. No rash noted.   Cardiovascular: Normal heart rate noted  Respiratory: Normal respiratory effort, no problems with respiration noted  Abdomen: Soft, gravid, appropriate for gestational age.  Pain/Pressure: Absent     Pelvic: Cervical exam deferred        Extremities: Normal range of motion.  Edema: None  Mental Status: Normal mood and affect. Normal behavior. Normal judgment and thought content.   Assessment and Plan:  Pregnancy: G2P0010 at [redacted]w[redacted]d 1. Encounter for supervision of low-risk pregnancy, antepartum (Primary) --Anticipatory guidance about next visits/weeks of pregnancy given.  --Reviewed anatomy US , EFW 42%, and today's FH wnl.  Due to pt small size, growth US  ordered  Q 6 weeks.   2. Depression affecting pregnancy --Pt stable, no concerns today  3. [redacted] weeks gestation of pregnancy   Preterm labor symptoms and general obstetric precautions including but not limited to vaginal bleeding, contractions, leaking of fluid and fetal movement were reviewed in detail with the patient. Please refer to After Visit Summary for other counseling recommendations.   Return in about 4 weeks (around 08/03/2023) for LOB.  Future Appointments  Date Time Provider Department Center  08/03/2023  1:30 PM Asher Lawn, CNM [facility] None  08/27/2023 10:00 AM [facility] PROVIDER 1 [facility]  08/27/2023 10:30 AM [facility] US4 [facility]    Arlester Bence, CNM

## 2023-07-17 ENCOUNTER — Inpatient Hospital Stay (HOSPITAL_COMMUNITY)
Admission: AD | Admit: 2023-07-17 | Discharge: 2023-07-17 | Disposition: A | Discharge status: Home / Self Care | Attending: Obstetrics and Gynecology | Admitting: Obstetrics and Gynecology

## 2023-07-17 ENCOUNTER — Encounter: Payer: Self-pay | Admitting: Certified Nurse Midwife

## 2023-07-17 ENCOUNTER — Other Ambulatory Visit: Payer: Self-pay

## 2023-07-17 DIAGNOSIS — R509 Fever, unspecified: Secondary | ICD-10-CM | POA: Diagnosis present

## 2023-07-17 DIAGNOSIS — B370 Candidal stomatitis: Secondary | ICD-10-CM | POA: Diagnosis not present

## 2023-07-17 DIAGNOSIS — O98812 Other maternal infectious and parasitic diseases complicating pregnancy, second trimester: Secondary | ICD-10-CM | POA: Insufficient documentation

## 2023-07-17 DIAGNOSIS — O26892 Other specified pregnancy related conditions, second trimester: Secondary | ICD-10-CM | POA: Diagnosis not present

## 2023-07-17 DIAGNOSIS — J988 Other specified respiratory disorders: Secondary | ICD-10-CM | POA: Diagnosis not present

## 2023-07-17 DIAGNOSIS — Z3A22 22 weeks gestation of pregnancy: Secondary | ICD-10-CM

## 2023-07-17 DIAGNOSIS — Z3400 Encounter for supervision of normal first pregnancy, unspecified trimester: Secondary | ICD-10-CM

## 2023-07-17 DIAGNOSIS — R519 Headache, unspecified: Secondary | ICD-10-CM | POA: Diagnosis present

## 2023-07-17 DIAGNOSIS — O99512 Diseases of the respiratory system complicating pregnancy, second trimester: Secondary | ICD-10-CM | POA: Insufficient documentation

## 2023-07-17 DIAGNOSIS — R051 Acute cough: Secondary | ICD-10-CM | POA: Diagnosis not present

## 2023-07-17 DIAGNOSIS — R059 Cough, unspecified: Secondary | ICD-10-CM | POA: Diagnosis present

## 2023-07-17 DIAGNOSIS — Z3492 Encounter for supervision of normal pregnancy, unspecified, second trimester: Secondary | ICD-10-CM

## 2023-07-17 LAB — URINALYSIS, ROUTINE W REFLEX MICROSCOPIC
Bilirubin Urine: NEGATIVE
Glucose, UA: NEGATIVE mg/dL
Hgb urine dipstick: NEGATIVE
Ketones, ur: NEGATIVE mg/dL
Leukocytes,Ua: NEGATIVE
Nitrite: NEGATIVE
Protein, ur: NEGATIVE mg/dL
Specific Gravity, Urine: 1.024 (ref 1.005–1.030)
pH: 6 (ref 5.0–8.0)

## 2023-07-17 LAB — RESP PANEL BY RT-PCR (RSV, FLU A&B, COVID)  RVPGX2
Influenza A by PCR: NEGATIVE
Influenza B by PCR: NEGATIVE
Resp Syncytial Virus by PCR: NEGATIVE
SARS Coronavirus 2 by RT PCR: NEGATIVE

## 2023-07-17 MED ORDER — FLUCONAZOLE 150 MG PO TABS: 150.0000 mg | ORAL_TABLET | Freq: Every day | ORAL | 1 refills | Status: DC

## 2023-07-17 MED ORDER — NYSTATIN 100000 UNIT/ML MT SUSP: 500000.0000 [IU] | Freq: Four times a day (QID) | OROMUCOSAL | 0 refills | Status: AC

## 2023-07-17 MED ORDER — ACETAMINOPHEN 500 MG PO TABS: 1000.0000 mg | ORAL_TABLET | Freq: Once | ORAL | Status: DC

## 2023-07-17 NOTE — MAU Provider Note (Signed)
 None     S [name] is a 18 y.o. G2P0010 pregnant female at [redacted]w[redacted]d who presents to MAU today with complaint of cough, runny nose and HA. She reports she has not used medication to treat any symptoms. She reports a Tmax of 100.0. Denies LOF or VB. Endorses good FM. She additionally reports "thrush" on her tongue as diagnosed by orthodontist, but "they couldn't treat me because I'm pregnant. They recommended I ask for something that started with an N." The thrush on her tongue has been there all pregnancy and she reports that it causes sensitivity.   Receives care at [facility]. Prenatal records reviewed.  Pertinent items noted in HPI and remainder of comprehensive ROS otherwise negative.   O BP (!) 99/59 (BP Location: Right Arm)   Pulse (!) 113   Temp 98.4 F (36.9 C) (Oral)   Resp 19   Ht 5\' 3"  (1.6 m)   Wt 49.4 kg   LMP 02/07/2023 (Exact Date)   SpO2 99%   BMI 19.29 kg/m  Physical Exam Vitals reviewed.  Constitutional:      Appearance: Normal appearance.  HENT:     Head: Normocephalic.     Mouth/Throat:      Comments: Slightly raised, creamy white, patch present on tongue.  Cardiovascular:     Rate and Rhythm: Normal rate.     Pulses: Normal pulses.     Heart sounds: Normal heart sounds.  Pulmonary:     Effort: Pulmonary effort is normal.  Skin:    General: Skin is warm.     Capillary Refill: Capillary refill takes less than 2 seconds.  Neurological:     Mental Status: She is alert and oriented to person, place, and time.  Psychiatric:        Mood and Affect: Mood normal.        Speech: Speech normal.        Behavior: Behavior normal.      MDM: Negative respiratory swabs PE with patch on tongue questionable for thrush, will treat based on history and recent vaginal candidiasis diagnosis.  Reviewed history and records.  MAU Course:  A Respiratory infection  Acute cough  [redacted] weeks gestation of pregnancy  Presence of fetal heart  activity in second trimester  Movement of fetus present during pregnancy in second trimester  Thrush, oral  Medical screening exam complete  P Discharge from MAU in stable condition with routine precautions Follow up at [facility] as scheduled for ongoing prenatal care - Prescription sent for oral nystatin 10,000 Units/ML suspension 4x daily for 7 days for thrush and once dose of 150mg  nystatin.  - Safe medications in pregnancy list provided, and symptom management counseled.    Allergies as of 07/17/2023   No Known Allergies      Medication List     TAKE these medications    acetaminophen  325 MG tablet Commonly known as: Tylenol  Take 2 tablets (650 mg total) by mouth every 6 (six) hours as needed for moderate pain (pain score 4-6).   Blood Pressure Kit Devi 1 Device by Does not apply route once a week.   fluconazole  150 MG tablet Commonly known as: Diflucan  Take 1 tablet (150 mg total) by mouth daily.   nystatin 100000 UNIT/ML suspension Commonly known as: MYCOSTATIN Take 5 mLs (500,000 Units total) by mouth 4 (four) times daily for 7 days.   ondansetron  4 MG tablet Commonly known as: Zofran  Take 1 tablet (4 mg total) by mouth  every 8 (eight) hours as needed for nausea or vomiting.   pyridOXINE 100 MG tablet Commonly known as: VITAMIN B6 Take 100 mg by mouth daily.   terconazole  0.8 % vaginal cream Commonly known as: TERAZOL 3  Place 1 applicator vaginally at bedtime. Apply nightly for three nights.   Vitafol  Gummies 3.33-0.333-34.8 MG Chew Chew 3 tablets by mouth daily.        Raford Bunk, MSN, CNM 07/18/2023 9:48 AM  Certified Nurse Midwife, [facility]

## 2023-07-17 NOTE — Discharge Instructions (Signed)

## 2023-07-17 NOTE — MAU Note (Signed)
 Tina Castaneda is a 18 y.o. at [redacted]w[redacted]d here in MAU reporting: she has a cough, runny nose, and HA.  States had a fever earlier today, but last temp was 100.0.  Reports hasn't taken any meds to treat symptoms. Denies VB or LOF.  Endorses +FM.  LMP: 02/07/2023 Onset of complaint: 1 day Pain score: 8 Vitals:   07/17/23 1711  BP: (!) 99/59  Pulse: (!) 113  Resp: 19  Temp: 98.4 F (36.9 C)  SpO2: 99%     FHT: 144 bpm  Lab orders placed from triage: Covid

## 2023-07-19 ENCOUNTER — Other Ambulatory Visit: Payer: Self-pay

## 2023-07-19 MED ORDER — FERROUS SULFATE 325 (65 FE) MG PO TABS: 325.0000 mg | ORAL_TABLET | Freq: Every day | ORAL | 3 refills | Status: DC

## 2023-07-28 ENCOUNTER — Ambulatory Visit: Admitting: Licensed Clinical Social Worker

## 2023-07-28 ENCOUNTER — Telehealth: Payer: Self-pay | Admitting: Licensed Clinical Social Worker

## 2023-07-28 DIAGNOSIS — F32A Depression, unspecified: Secondary | ICD-10-CM

## 2023-07-28 NOTE — Telephone Encounter (Signed)
 [facility] contacted patient on this date to provide [facility] intro and to schedule [facility] appointment. Appt scheduled for 07/28/23 at 3:15p

## 2023-07-28 NOTE — BH Specialist Note (Signed)
 Integrated Behavioral Health via Telemedicine Visit  08/03/2023 Tina Castaneda [id number]  Number of Integrated Behavioral Health Clinician visits: 1- Initial Visit  Session Start time: 1514   Session End time: 1602  Total time in minutes: 48   Referring Provider:  Patient/Family location: At [facility] Provider location: Remote Office All persons participating in visit: [facility] and Patient Types of Service: Individual psychotherapy and Video visit  I connected with Tina Castaneda and/or Tina Castaneda patient via  Telephone or Engineer, civil (consulting)  (Video is Caregility application) and verified that I am speaking with the correct person using two identifiers. Discussed confidentiality: Yes   I discussed the limitations of telemedicine and the availability of in person appointments.  Discussed there is a possibility of technology failure and discussed alternative modes of communication if that failure occurs.  I discussed that engaging in this telemedicine visit, they consent to the provision of behavioral healthcare and the services will be billed under their insurance.  Patient and/or legal guardian expressed understanding and consented to Telemedicine visit: Yes   Presenting Concerns: Patient and/or family reports the following symptoms/concerns: Increased depression symptoms  Duration of problem: ; Years Severity of problem: moderate  Patient and/or Family's Strengths/Protective Factors: Social and Emotional competence, Concrete supports in place (healthy food, safe environments, etc.), and Physical Health (exercise, healthy diet, medication compliance, etc.)  Goals Addressed: Patient will:  Reduce symptoms of: depression   Increase knowledge and/or ability of: coping skills and healthy habits   Demonstrate ability to: Increase healthy adjustment to current life circumstances  Progress towards  Goals: Ongoing  Interventions: Interventions utilized:  Supportive Counseling, Psychoeducation and/or Health Education, and Supportive Reflection Standardized Assessments completed: Not Needed      Patient and/or Family Response: The patient was present and engaged in today's virtual session. She shared a longstanding history of depression beginning at age 18, following the traumatic loss of her best friend to suicide. She reported past suicidal ideation and multiple hospitalizations at a behavioral health facility between the ages of 18 and 18. During that time, the patient learned and began utilizing coping strategies such as baking, journaling, and listening to music, which she found helpful in managing her symptoms and avoiding further hospitalization. The patient expressed that she continues to struggle with internalized anger, often feeling sad or frustrated when unable to express it effectively. She acknowledged that in the past, her anger was often expressed physically through fighting, though she now recognizes the negative consequences of such behavior. With her current pregnancy, the patient expressed a strong desire to make healthier decisions, stating that her unborn daughter gives her a renewed sense of purpose and motivation. She voiced interest in identifying and practicing new coping strategies to manage her anger and depressive symptoms more constructively.   Assessment: Patient currently experiencing ongoing symptoms of depression and unresolved anger, rooted in past trauma and loss. She is motivated to make positive changes due to her pregnancy and is seeking healthier coping strategies to manage her emotions.   Patient may benefit from continued support form integrated behavioral health.  Plan: Follow up with behavioral health clinician on : 08/05/23 Behavioral recommendations: Tina Castaneda to explore and implement healthy coping strategies for managing anger and depressive  symptoms, such as journaling, baking, music, and mindfulness techniques. Continue engaging in ongoing therapy to process unresolved trauma and develop emotional regulation skills, especially in preparation for motherhood. Referral(s): Integrated [employer] (In Clinic)  I discussed the  assessment and treatment plan with the patient and/or parent/guardian. They were provided an opportunity to ask questions and all were answered. They agreed with the plan and demonstrated an understanding of the instructions.   They were advised to call back or seek an in-person evaluation if the symptoms worsen or if the condition fails to improve as anticipated.  Tina Castaneda, LCSWA

## 2023-08-01 ENCOUNTER — Inpatient Hospital Stay (HOSPITAL_COMMUNITY)
Admission: AD | Admit: 2023-08-01 | Discharge: 2023-08-01 | Disposition: A | Discharge status: Home / Self Care | Attending: Obstetrics & Gynecology | Admitting: Obstetrics & Gynecology

## 2023-08-01 ENCOUNTER — Other Ambulatory Visit: Payer: Self-pay

## 2023-08-01 DIAGNOSIS — O368120 Decreased fetal movements, second trimester, not applicable or unspecified: Secondary | ICD-10-CM | POA: Diagnosis present

## 2023-08-01 DIAGNOSIS — Z3A25 25 weeks gestation of pregnancy: Secondary | ICD-10-CM

## 2023-08-01 NOTE — MAU Note (Signed)
 Emmit Harold Brannen is a 18 y.o. at [redacted]w[redacted]d here in MAU reporting: not feeling any FM today and not as much as usual last night. States baby is usually really active at night. Reports eating and drinking cold fluids today to try to get baby to move and nothing has worked. States she used at home doppler and heard FHR. Denies pain or LOF. Reports spotting on toilet paper yesterday when wiping after using the restroom, but denies VB today.   LMP: NA Onset of complaint: last night Pain score: 0 Vitals:   08/01/23 1917  BP: 123/71  Pulse: (!) 112  Resp: 18  Temp: 98.4 F (36.9 C)  SpO2: 100%     FHT: 156  Lab orders placed from triage: none  Basilio Limb, MD in triage with [facility].

## 2023-08-01 NOTE — MAU Provider Note (Signed)
 DFM    S [name] is a 18 y.o. G2P0010 pregnant female at [redacted]w[redacted]d who presents to MAU today with complaint of DFM. Pt states decreased fetal movement last night and none today.  States baby usually active at night and not responsive to cold fluids.  Mom states she heard FHR with home doppler and confirmed here in triage at 156bpm.  Denies pain or LOF.  Pt states had some spotting while wiping yesterday but none since.   Receives care at [facility]. Prenatal records reviewed.  Pertinent items noted in HPI and remainder of comprehensive ROS otherwise negative.   O BP 123/71 (BP Location: Right Arm)   Pulse (!) 112   Temp 98.4 F (36.9 C) (Oral)   Resp 18   Ht 5\' 3"  (1.6 m)   Wt 51 kg   LMP 02/07/2023 (Exact Date)   SpO2 100%   BMI 19.91 kg/m  Physical Exam Vitals and nursing note reviewed.  Constitutional:      General: She is not in acute distress.    Appearance: Normal appearance. She is not ill-appearing.  HENT:     Head: Normocephalic and atraumatic.     Right Ear: External ear normal.     Left Ear: External ear normal.     Nose: Nose normal. No congestion.     Mouth/Throat:     Mouth: Mucous membranes are moist.     Pharynx: Oropharynx is clear.  Eyes:     Extraocular Movements: Extraocular movements intact.     Conjunctiva/sclera: Conjunctivae normal.  Cardiovascular:     Rate and Rhythm: Normal rate.  Pulmonary:     Effort: Pulmonary effort is normal. No respiratory distress.  Abdominal:     General: There is no distension.     Palpations: Abdomen is soft.     Tenderness: There is no abdominal tenderness.     Comments: gravid  Musculoskeletal:        General: No swelling. Normal range of motion.     Cervical back: Normal range of motion.  Skin:    General: Skin is warm and dry.  Neurological:     Mental Status: She is alert and oriented to person, place, and time. Mental status is at baseline.     Motor: No weakness.     Gait: Gait  normal.  Psychiatric:        Behavior: Behavior normal.     Comments: anxious   Pt informed that the ultrasound is considered a limited OB ultrasound and is not intended to be a complete ultrasound exam.  Patient also informed that the ultrasound is not being completed with the intent of assessing for fetal or placental anomalies or any pelvic abnormalities.  Explained that the purpose of today's ultrasound is to assess for  viability.  Patient acknowledges the purpose of the exam and the limitations of the study.    My interpretation: seen excellent fetal cardiac activity as well as abundant movement    MDM: low MAU Course:  DFM resolved and patient reassured with seeing the infant movement on US  and seeing / hearing heart rate.  Pt stable for d/c.    AP #[redacted] weeks gestation #DFM, resolved  Discharge from MAU in stable condition with strict/usual precautions Follow up at [facility] as scheduled for ongoing prenatal care  Allergies as of 08/01/2023   No Known Allergies      Medication List     TAKE these medications    acetaminophen   325 MG tablet Commonly known as: Tylenol  Take 2 tablets (650 mg total) by mouth every 6 (six) hours as needed for moderate pain (pain score 4-6).   Blood Pressure Kit Devi 1 Device by Does not apply route once a week.   ferrous sulfate  325 (65 FE) MG tablet Take 1 tablet (325 mg total) by mouth daily with breakfast.   fluconazole  150 MG tablet Commonly known as: Diflucan  Take 1 tablet (150 mg total) by mouth daily.   ondansetron  4 MG tablet Commonly known as: Zofran  Take 1 tablet (4 mg total) by mouth every 8 (eight) hours as needed for nausea or vomiting.   pyridOXINE 100 MG tablet Commonly known as: VITAMIN B6 Take 100 mg by mouth daily.   terconazole  0.8 % vaginal cream Commonly known as: TERAZOL 3  Place 1 applicator vaginally at bedtime. Apply nightly for three nights.   Vitafol  Gummies 3.33-0.333-34.8 MG Chew Chew 3 tablets by  mouth daily.        Ebony Goldstein, MD 08/01/2023 7:30 PM

## 2023-08-03 ENCOUNTER — Encounter: Payer: Self-pay | Admitting: Advanced Practice Midwife

## 2023-08-03 ENCOUNTER — Ambulatory Visit (INDEPENDENT_AMBULATORY_CARE_PROVIDER_SITE_OTHER): Admitting: Advanced Practice Midwife

## 2023-08-03 VITALS — BP 102/64 | HR 108 | Wt 111.0 lb

## 2023-08-03 DIAGNOSIS — Z3480 Encounter for supervision of other normal pregnancy, unspecified trimester: Secondary | ICD-10-CM

## 2023-08-03 DIAGNOSIS — Z3A25 25 weeks gestation of pregnancy: Secondary | ICD-10-CM | POA: Diagnosis not present

## 2023-08-03 NOTE — Progress Notes (Signed)
 Pt presents for ROB visit. Pt went to MAU for decreased movement. Pt c/o rashes on the arms and abd

## 2023-08-03 NOTE — Progress Notes (Addendum)
   PRENATAL VISIT NOTE  Subjective:  Tina Castaneda is a 18 y.o. G2P0010 at [redacted]w[redacted]d being seen today for ongoing prenatal care.  She is currently monitored for the following issues for this low-risk pregnancy and has At risk for tuberculosis; Suicide ideation; Severe recurrent major depression without psychotic features (HCC); Tylenol  overdose; Suicide attempt [facility]); MDD (major depressive disorder), severe (HCC); Vomiting complicating pregnancy; Underweight; and Encounter for supervision of normal pregnancy in teen primigravida, antepartum on their problem list.  Patient reports no complaints.  Contractions: Not present. Vag. Bleeding: None.  Movement: Present. Denies leaking of fluid.   The following portions of the patient's history were reviewed and updated as appropriate: allergies, current medications, past family history, past medical history, past social history, past surgical history and problem list.   Objective:    Vitals:   08/03/23 1343  BP: 102/64  Pulse: (!) 108  Weight: 111 lb (50.3 kg)    Fetal Status:  Fetal Heart Rate (bpm): 146 Fundal Height: 25 cm Movement: Present    General: Alert, oriented and cooperative. Patient is in no acute distress.  Skin: Skin is warm and dry. No rash noted.   Cardiovascular: Normal heart rate noted  Respiratory: Normal respiratory effort, no problems with respiration noted  Abdomen: Soft, gravid, appropriate for gestational age.  Pain/Pressure: Absent     Pelvic: Cervical exam deferred        Extremities: Normal range of motion.  Edema: None  Mental Status: Normal mood and affect. Normal behavior. Normal judgment and thought content.   Assessment and Plan:  Pregnancy: G2P0010 at [redacted]w[redacted]d 1. Supervision of other normal pregnancy, antepartum (Primary) --Anticipatory guidance about next visits/weeks of pregnancy given.  --Note provided for pt new job with next 2 appointments, GTT in 3 weeks and next prenatal 2 weeks after  that.  2. [redacted] weeks gestation of pregnancy   Preterm labor symptoms and general obstetric precautions including but not limited to vaginal bleeding, contractions, leaking of fluid and fetal movement were reviewed in detail with the patient. Please refer to After Visit Summary for other counseling recommendations.   Return in about 3 weeks (around 08/24/2023) for LOB, GTT at next visit.  Future Appointments  Date Time Provider Department Center  08/05/2023  9:45 AM Alfrieda Antes [facility] None  08/27/2023 10:00 AM [facility] PROVIDER 1 [facility]  08/27/2023 10:30 AM [facility] US4 [facility]    Arlester Bence, CNM

## 2023-08-05 ENCOUNTER — Ambulatory Visit: Admitting: Licensed Clinical Social Worker

## 2023-08-05 NOTE — BH Specialist Note (Signed)
 [facility] sent virtual link to patient on this date. [facility] contacted patient and left VM. Patient no showed for today's visit.

## 2023-08-06 ENCOUNTER — Telehealth: Payer: Self-pay

## 2023-08-06 NOTE — Telephone Encounter (Signed)
 Returned call, pt states that provider gave her a letter for her employer to excuse her for appt on Tuesday, but she is now sending in documents through mychart to be filed out to excuse for all prenatal appts.

## 2023-08-23 ENCOUNTER — Ambulatory Visit

## 2023-08-23 ENCOUNTER — Ambulatory Visit (HOSPITAL_BASED_OUTPATIENT_CLINIC_OR_DEPARTMENT_OTHER): Attending: Obstetrics and Gynecology | Admitting: Maternal & Fetal Medicine

## 2023-08-23 VITALS — BP 97/61 | HR 112

## 2023-08-23 DIAGNOSIS — O99343 Other mental disorders complicating pregnancy, third trimester: Secondary | ICD-10-CM | POA: Diagnosis not present

## 2023-08-23 DIAGNOSIS — O99013 Anemia complicating pregnancy, third trimester: Secondary | ICD-10-CM | POA: Diagnosis not present

## 2023-08-23 DIAGNOSIS — Z3403 Encounter for supervision of normal first pregnancy, third trimester: Secondary | ICD-10-CM | POA: Diagnosis not present

## 2023-08-23 DIAGNOSIS — Z362 Encounter for other antenatal screening follow-up: Secondary | ICD-10-CM | POA: Insufficient documentation

## 2023-08-23 DIAGNOSIS — Z3A28 28 weeks gestation of pregnancy: Secondary | ICD-10-CM | POA: Diagnosis not present

## 2023-08-23 DIAGNOSIS — O99212 Obesity complicating pregnancy, second trimester: Secondary | ICD-10-CM | POA: Diagnosis present

## 2023-08-23 DIAGNOSIS — O99340 Other mental disorders complicating pregnancy, unspecified trimester: Secondary | ICD-10-CM | POA: Insufficient documentation

## 2023-08-23 DIAGNOSIS — Z0375 Encounter for suspected cervical shortening ruled out: Secondary | ICD-10-CM

## 2023-08-23 DIAGNOSIS — Z3400 Encounter for supervision of normal first pregnancy, unspecified trimester: Secondary | ICD-10-CM

## 2023-08-23 DIAGNOSIS — O2613 Low weight gain in pregnancy, third trimester: Secondary | ICD-10-CM | POA: Diagnosis not present

## 2023-08-23 DIAGNOSIS — D649 Anemia, unspecified: Secondary | ICD-10-CM | POA: Diagnosis present

## 2023-08-23 NOTE — Progress Notes (Signed)
   Patient information  Patient Name: Tina Castaneda  Patient MRN:   [id number]  Referring practice: MFM Referring Provider: [facility]  Problem List   Patient Active Problem List   Diagnosis Date Noted   Vomiting complicating pregnancy 05/11/2023   Underweight 05/11/2023   Encounter for supervision of normal pregnancy in teen primigravida, antepartum 05/11/2023   Tylenol  overdose 10/27/2020   Suicide attempt [facility])    Severe recurrent major depression without psychotic features (HCC) 08/28/2019   Suicide ideation 02/16/2018   MDD (major depressive disorder), severe (HCC) 02/16/2018   At risk for tuberculosis 10/30/2016    Maternal Fetal medicine Consult  Tina Castaneda [place] [facility] is a 18 y.o. G2P0010 at [redacted]w[redacted]d here for ultrasound and consultation. Tina Castaneda Tina Castaneda is doing well today with no acute concerns. Today we focused on the following:   Low BMI:  The patient is doing well today and has gained about 17 lbs this pregnancy. The fetal weight is stable. Unless other indications arise there is no need for future ultrasounds at this time.   The patient had time to ask questions that were answered to her satisfaction.  She verbalized understanding and agrees to proceed with the plan below.  Sonographic findings Single intrauterine pregnancy at 28w 1d.  Fetal cardiac activity:  Observed and appears normal. Presentation: Cephalic. Interval fetal anatomy appears normal. Fetal biometry shows the estimated fetal weight at the 31 percentile. Amniotic fluid volume: Within normal limits. MVP: 6.58 cm. Placenta: Posterior.  There are limitations of prenatal ultrasound such as the inability to detect certain abnormalities due to poor visualization. Various factors such as fetal position, gestational age and maternal body habitus may increase the difficulty in visualizing the fetal anatomy.    Recommendations -No further ultrasounds are  recommended at this time based on the current indications. If future indications arise (e.g. size/date discrepancy on fundal height, gestational diabetes or hypertension) and an ultrasound is to be desired at our MFM office, please send a referral.   Review of Systems: A review of systems was performed and was negative except per HPI   Vitals and Physical Exam    08/23/2023    2:25 PM 08/03/2023    1:43 PM 08/01/2023    7:17 PM  Vitals with BMI  Height   5' 3  Weight  111 lbs 112 lbs 6 oz  BMI  19.67 19.92  Systolic 97 102 123  Diastolic 61 64 71  Pulse 112 108 112    Sitting comfortably on the sonogram table Nonlabored breathing Normal rate and rhythm Abdomen is nontender  Past pregnancies OB History  Gravida Para Term Preterm AB Living  2 0 0 0 1 0  SAB IAB Ectopic Multiple Live Births  0 1 0 0 0    # Outcome Date GA Lbr Len/2nd Weight Sex Type Anes PTL Lv  2 Current           1 IAB              I spent 20 minutes reviewing the patients chart, including labs and images as well as counseling the patient about her medical conditions. Greater than 50% of the time was spent in direct face-to-face patient counseling.  [name]  MFM, [place]   08/23/2023  3:01 PM

## 2023-08-24 ENCOUNTER — Encounter: Admitting: Advanced Practice Midwife

## 2023-08-24 ENCOUNTER — Other Ambulatory Visit

## 2023-08-27 ENCOUNTER — Ambulatory Visit

## 2023-08-27 ENCOUNTER — Encounter (HOSPITAL_COMMUNITY): Payer: Self-pay | Admitting: Family Medicine

## 2023-08-27 ENCOUNTER — Telehealth: Payer: Self-pay

## 2023-08-27 ENCOUNTER — Inpatient Hospital Stay (HOSPITAL_COMMUNITY)
Admission: AD | Admit: 2023-08-27 | Discharge: 2023-08-27 | Disposition: A | Discharge status: Home / Self Care | Attending: Family Medicine | Admitting: Family Medicine

## 2023-08-27 DIAGNOSIS — R109 Unspecified abdominal pain: Secondary | ICD-10-CM | POA: Insufficient documentation

## 2023-08-27 DIAGNOSIS — B3731 Acute candidiasis of vulva and vagina: Secondary | ICD-10-CM | POA: Diagnosis not present

## 2023-08-27 DIAGNOSIS — O26893 Other specified pregnancy related conditions, third trimester: Secondary | ICD-10-CM | POA: Diagnosis present

## 2023-08-27 DIAGNOSIS — N898 Other specified noninflammatory disorders of vagina: Secondary | ICD-10-CM | POA: Insufficient documentation

## 2023-08-27 DIAGNOSIS — Z3A28 28 weeks gestation of pregnancy: Secondary | ICD-10-CM

## 2023-08-27 DIAGNOSIS — O23593 Infection of other part of genital tract in pregnancy, third trimester: Secondary | ICD-10-CM | POA: Insufficient documentation

## 2023-08-27 DIAGNOSIS — Z3400 Encounter for supervision of normal first pregnancy, unspecified trimester: Secondary | ICD-10-CM

## 2023-08-27 DIAGNOSIS — O98813 Other maternal infectious and parasitic diseases complicating pregnancy, third trimester: Secondary | ICD-10-CM | POA: Diagnosis not present

## 2023-08-27 LAB — POCT FERN TEST: POCT Fern Test: NEGATIVE

## 2023-08-27 LAB — WET PREP, GENITAL
Clue Cells Wet Prep HPF POC: NONE SEEN
Sperm: NONE SEEN
Trich, Wet Prep: NONE SEEN
WBC, Wet Prep HPF POC: >=10 — AB (ref <10)

## 2023-08-27 LAB — RUPTURE OF MEMBRANE (ROM)PLUS: Rom Plus: NEGATIVE

## 2023-08-27 MED ORDER — FLUCONAZOLE 150 MG PO TABS
150.0000 mg | ORAL_TABLET | Freq: Once | ORAL | Status: AC
  Administered 2023-08-27: 150 mg via ORAL
  Filled 2023-08-27: qty 1

## 2023-08-27 MED ORDER — FLUCONAZOLE 150 MG PO TABS: 150.0000 mg | ORAL_TABLET | Freq: Once | ORAL | Status: DC

## 2023-08-27 NOTE — MAU Note (Signed)
 Tina Castaneda is a 18 y.o. at [redacted]w[redacted]d here in MAU reporting: woke up this morning had some cramping  and went to BR a lot of fluid came out that was clear did not think it was urine. Stated underwear is a little wet. Has felt cramping every few hours.  Good fetal movement felt LMP:  Onset of complaint: this morning Pain score: 6-7 There were no vitals filed for this visit.   FHT: 147  Lab orders placed from triage: fern

## 2023-08-27 NOTE — Progress Notes (Signed)
 MAU Provider Note  Chief Complaint: Abdominal Pain and Vaginal Discharge  SUBJECTIVE HPI: Tina Castaneda is a 18 y.o. G2P0010 at [redacted]w[redacted]d by LMP who presents to maternity admissions reporting leakage of clear, odorless fluid this morning when she used the restroom. Uncertain if fluid is sticky. She does wear underwear liners but has not soaked through a liner. No vaginal bleeding. Intermittent abdominal cramping up to twice per day for the last 8 weeks. Fetal movement normal. Of note, patient reports recently completed treatment for both BV and yeast. Not yet completed TOC.  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 Receives [facility] with [facility].      Past Medical History:  Diagnosis Date   Anemia    Anxiety    Asthma    Depression    Past Surgical History:  Procedure Laterality Date   WISDOM TOOTH EXTRACTION     Social History   Socioeconomic History   Marital status: Single    Spouse name: Not on file   Number of children: Not on file   Years of education: Not on file   Highest education level: Not on file  Occupational History   Not on file  Tobacco Use   Smoking status: Never    Passive exposure: Never   Smokeless tobacco: Never  Vaping Use   Vaping status: Never Used  Substance and Sexual Activity   Alcohol use: Not Currently   Drug use: Not Currently    Types: Marijuana   Sexual activity: Yes    Birth control/protection: None  Other Topics Concern   Not on file  Social History Narrative   Lives at home with maternal grandmother. Per pt grandmother is legal guardian. No pets or smoking in the home.   Social Drivers of Corporate investment banker Strain: Low Risk  (03/22/2023)   Received from [facility]   Overall Financial Resource Strain (CARDIA)    Difficulty of Paying Living Expenses: Not hard at all  Food Insecurity: No Food Insecurity (08/27/2023)   Hunger Vital Sign    Worried About Running Out of  Food in the Last Year: Never true    Ran Out of Food in the Last Year: Never true  Transportation Needs: No Transportation Needs (08/27/2023)   PRAPARE - Administrator, Civil Service (Medical): No    Lack of Transportation (Non-Medical): No  Physical Activity: Insufficiently Active (03/22/2023)   Received from [facility]   Exercise Vital Sign    On average, how many days per week do you engage in moderate to strenuous exercise (like a brisk walk)?: 4 days    On average, how many minutes do you engage in exercise at this level?: 20 min  Stress: No Stress Concern Present (03/22/2023)   Received from [facility] - Occupational Stress Questionnaire    Feeling of Stress : Only a little  Social Connections: Socially Integrated (03/22/2023)   Received from [facility]   Social Network    How would you rate your social network (family, work, friends)?: Good participation with social networks  Intimate Partner Violence: Not At Risk (08/27/2023)   Humiliation, Afraid, Rape, and Kick questionnaire    Fear of Current or Ex-Partner: No    Emotionally Abused: No    Physically Abused: No    Sexually Abused: No   No current facility-administered medications on file prior to encounter.   Current Outpatient Medications  on File Prior to Encounter  Medication Sig Dispense Refill   Prenatal Vit-Fe Phos-FA-Omega (VITAFOL  GUMMIES) 3.33-0.333-34.8 MG CHEW Chew 3 tablets by mouth daily. 90 tablet 11   ferrous sulfate  325 (65 FE) MG tablet Take 1 tablet (325 mg total) by mouth daily with breakfast. (Patient not taking: Reported on 08/23/2023) 30 tablet 3   fluconazole  (DIFLUCAN ) 150 MG tablet Take 1 tablet (150 mg total) by mouth daily. (Patient not taking: Reported on 08/03/2023) 1 tablet 1   terconazole  (TERAZOL 3 ) 0.8 % vaginal cream Place 1 applicator vaginally at bedtime. Apply nightly for three nights. (Patient not taking: Reported on 08/03/2023) 20 g  0   No Known Allergies  ROS:  Pertinent positives/negatives listed above.  I have reviewed patient's Past Medical Hx, Surgical Hx, Family Hx, Social Hx, medications and allergies.   Physical Exam  Patient Vitals for the past 24 hrs:  BP Temp Temp src Pulse Resp SpO2 Height Weight  08/27/23 1128 -- -- -- -- -- 100 % -- --  08/27/23 1115 -- -- -- -- -- 100 % -- --  08/27/23 1058 110/65 98.4 F (36.9 C) Oral (!) 111 18 100 % -- --  08/27/23 1057 -- -- -- -- -- 100 % -- --  08/27/23 1035 (!) 116/47 98.3 F (36.8 C) -- (!) 115 18 -- 5' 3 (1.6 m) 54.4 kg   Constitutional: Well-developed, well-nourished female in no acute distress  Cardiovascular: normal rate Respiratory: normal effort Neurologic: Alert and oriented x 4   PELVIC EXAM: Cervix pink, friable, visually 1 cm, without lesion, moderate white creamy discharge, vaginal walls erythematous and friable, and external genitalia normal  FHT:  Baseline 150 , moderate variability, appropriate for gestational age Contractions: none, uterine irritability  LAB RESULTS Results for orders placed or performed during the hospital encounter of 08/27/23 (from the past 24 hours)  Wet prep, genital     Status: Abnormal   Collection Time: 08/27/23 11:33 AM  Result Value Ref Range   Yeast Wet Prep HPF POC PRESENT (A) NONE SEEN   Trich, Wet Prep NONE SEEN NONE SEEN   Clue Cells Wet Prep HPF POC NONE SEEN NONE SEEN   WBC, Wet Prep HPF POC >=10 (A) <10   Sperm NONE SEEN   Rupture of Membrane (ROM) Plus     Status: None   Collection Time: 08/27/23 11:33 AM  Result Value Ref Range   Rom Plus NEGATIVE   Fern Test     Status: Normal   Collection Time: 08/27/23 11:46 AM  Result Value Ref Range   POCT Fern Test Negative = intact amniotic membranes     O/Positive/-- (04/08 1017)  IMAGING US  MFM OB FOLLOW UP Result Date: 08/23/2023 ----------------------------------------------------------------------  OBSTETRICS REPORT                        (Signed Final 08/23/2023 03:22 pm) ---------------------------------------------------------------------- Patient Info  ID #:       [id number]                          D.O.B.:  May 08, 2005 (18 yrs)(F)  Name:       Tina Castaneda               Visit Date: 08/23/2023 02:17 pm              MONAE Hoelting ---------------------------------------------------------------------- Performed By  Attending:        Delora  Schaible DO       Ref. Address:     [address]                                                             [place], [place]                                                             [address]  Performed By:     Comer Harrow       [facility]  Referred By:      [facility] ---------------------------------------------------------------------- Orders  #  Description                           Code        Ordered By  1  US  MFM OB FOLLOW UP                   23183.98    FREDIA FRESH ----------------------------------------------------------------------  #  Order #                     Accession #                Episode #  1  [id number]                   [id number]                 [id number] ---------------------------------------------------------------------- Indications  Anemia during pregnancy in third trimester     O99.013  Other mental disorder complicating  O99.340  pregnancy, third trimester  Encounter for other antenatal screening        Z36.2  follow-up  [redacted] weeks gestation of pregnancy                Z3A.28  LR NIPS/ Neg Horizon/ Neg AFP  Pregravid BMI 16.6 ---------------------------------------------------------------------- Vital Signs  BP:          97/61  ---------------------------------------------------------------------- Fetal Evaluation  Num Of Fetuses:         1  Fetal Heart Rate(bpm):  161  Cardiac Activity:       Observed  Presentation:           Cephalic  Placenta:               Posterior  P. Cord Insertion:      Previously seen  Amniotic Fluid  AFI FV:      Within normal limits  AFI Sum(cm)     %Tile       Largest Pocket(cm)  19.95           79          6.58  RUQ(cm)       RLQ(cm)       LUQ(cm)        LLQ(cm)  6.58          5.69          3.18           4.5 ---------------------------------------------------------------------- Biometry  BPD:      66.7  mm     G. Age:  26w 6d          8  %    CI:        73.19   %    70 - 86                                                          FL/HC:      20.8   %    18.8 - 20.6  HC:      247.8  mm     G. Age:  26w 6d        2.5  %    HC/AC:      1.02        1.05 - 1.21  AC:      243.4  mm     G. Age:  28w 4d         57  %    FL/BPD:     77.4   %    71 - 87  FL:       51.6  mm     G. Age:  27w 4d         20  %    FL/AC:      21.2   %    20 - 24  CER:        34  mm     G. Age:  28w 6d         65  %  Est. FW:    1154  gm      2 lb 9 oz     31  % ---------------------------------------------------------------------- OB History  Gravidity:    1  Term:   0        Prem:   0        SAB:   0  TOP:          0       Ectopic:  0        Living: 0 ---------------------------------------------------------------------- Gestational Age  LMP:           28w 1d        Date:  02/07/23                  EDD:   11/14/23  U/S Today:     27w 3d                                        EDD:   11/19/23  Best:          28w 1d     Det. By:  LMP  (02/07/23)          EDD:   11/14/23 ---------------------------------------------------------------------- Targeted Anatomy  Central Nervous System  Calvarium/Cranial V.:  Appears normal         Cereb./Vermis:          Appears normal  Cavum:                 Appears normal         Cisterna Magna:          Appears normal  Lateral Ventricles:    Appears normal         Midline Falx:           Appears normal  Choroid Plexus:        Previously seen  Spine  Cervical:              Previously seen        Sacral:                 Previously seen  Thoracic:              Previously seen        Shape/Curvature:        Previously seen  Lumbar:                Previously seen  Head/Neck  Lips:                  Appears normal         Profile:                Appears normal  Neck:                  Previously seen        Orbits/Eyes:            Previously seen  Nuchal Fold:           Previously seen        Mandible:               Previously seen  Nasal Bone:            Present                Maxilla:                Previously seen  Thorax  4 Chamber View:        Previously seen  Interventr. Septum:     Previously seen  Cardiac Rhythm:        Normal                 Cardiac Axis:           Normal  Cardiac Situs:         Previously seen        Diaphragm:              Appears normal  Rt Outflow Tract:      Previously seen        3 Vessel View:          Previously seen  Lt Outflow Tract:      Previously seen        3 V Trachea View:       Previously seen  Aortic Arch:           Previously seen        IVC:                    Previously seen  Ductal Arch:           Previously seen        Crossing:               Previously seen  SVC:                   Previously seen  Abdomen  Ventral Wall:          Previously seen        Lt Kidney:              Appears normal  Cord Insertion:        Previously seen        Rt Kidney:              Appears normal  Situs:                 Previously seen        Bladder:                Appears normal  Stomach:               Appears normal  Extremities  Lt Humerus:            Previously seen        Lt Femur:               Previously seen  Rt Humerus:            Previously seen        Rt Femur:               Previously seen  Lt Forearm:            Previously seen        Lt Lower Leg:           Previously seen   Rt Forearm:            Previously seen        Rt Lower Leg:           Previously seen  Lt Hand:               Previously seen        Lt Foot:                Previously seen  Rt Hand:  Previously seen        Rt Foot:                Previously seen  Other  Umbilical Cord:        Previously seen        Genitalia:              Female prev seen  Comment:     Fetal anatomic survey complete. ---------------------------------------------------------------------- Comments  Maternal Fetal medicine Consult  ALESSA MAZUR [facility] is a 18 y.o.  G2P0010 at [redacted]w[redacted]d here for ultrasound and consultation.  Myrl KEISHUNA [facility] is doing well  today with no acute concerns. Today we focused on the  following:  Low BMI:  The patient is doing well today and has gained  about 17 lbs this pregnancy. The fetal weight is stable.  Unless other indications arise there is no need for future  ultrasounds at this time.  The patient had time to ask questions that were answered to  her satisfaction.  She verbalized understanding and agrees to  proceed with the plan below.  Sonographic findings  Single intrauterine pregnancy at 28w 1d.  Fetal cardiac activity:  Observed and appears normal.  Presentation: Cephalic.  Interval fetal anatomy appears normal.  Fetal biometry shows the estimated fetal weight at the 31  percentile.  Amniotic fluid volume: Within normal limits. MVP: 6.58 cm.  Placenta: Posterior.  There are limitations of prenatal ultrasound such as the  inability to detect certain abnormalities due to poor  visualization. Various factors such as fetal position,  gestational age and maternal body habitus may increase the  difficulty in visualizing the fetal anatomy.  Recommendations  -No further ultrasounds are recommended at this time based  on the current indications. If future indications arise (e.g.  size/date discrepancy on fundal height, gestational diabetes  or hypertension) and an ultrasound is to be  desired at our  MFM office, please send a referral. ----------------------------------------------------------------------                  [name], DO Electronically Signed Final Report   08/23/2023 03:22 pm ----------------------------------------------------------------------    MAU Management/MDM: Orders Placed This Encounter  Procedures   Wet prep, genital   Rupture of Membrane (ROM) Plus   Ferning   Fern Test    Meds ordered this encounter  Medications   DISCONTD: fluconazole  (DIFLUCAN ) tablet 150 mg   fluconazole  (DIFLUCAN ) tablet 150 mg     Available prenatal records reviewed.   ASSESSMENT 1. Encounter for supervision of normal pregnancy in teen primigravida, antepartum   2. [redacted] weeks gestation of pregnancy   3. Candida vaginitis      PLAN Rule out ROM with ferning and ROM Plus; ferning, pooling & ROM Plus negative  2. Follow up GC/Chlamydia results 3. Wet prep with yeast present; treat with single dose diflucan  150 mg x1 5. Discharge home with strict return precautions.  Allergies as of 08/27/2023   No Known Allergies      Medication List     TAKE these medications    ferrous sulfate  325 (65 FE) MG tablet Take 1 tablet (325 mg total) by mouth daily with breakfast.   fluconazole  150 MG tablet Commonly known as: Diflucan  Take 1 tablet (150 mg total) by mouth daily.   terconazole  0.8 % vaginal cream Commonly known as: TERAZOL 3  Place 1 applicator vaginally at bedtime. Apply nightly for three nights.   Vitafol  Gummies 3.33-0.333-34.8 MG Chew Chew  3 tablets by mouth daily.        Follow-up Information     [facility] for [facility] Follow up.   Specialty: Obstetrics and Gynecology Why: Continue routine prenatal care as scheduled Contact information: [address] [place]  [address] [phone]                Elio Alena Morrison, MD [facility] PGY-1 08/27/2023  12:18 PM    GME ATTESTATION:  Evaluation and management procedures were performed by the [facility] Medicine Resident under my supervision. I was immediately available for direct supervision, assistance and direction throughout this encounter.  I also confirm that I have verified the information documented in the resident's note, and that I have also personally reperformed the pertinent components of the physical exam and all of the medical decision making activities.  I have also made any necessary editorial changes.  Mardy Shropshire, MD OB Fellow, Faculty Practice [facility], [facility] for [facility] 08/27/2023 12:20 PM

## 2023-08-27 NOTE — Telephone Encounter (Signed)
 Call returned to patient. She reports leaking of fluid. She states she got up in the middle of the night as usual to use the bathroom. When we woke up to get ready for work around 7:30/8 am, she was leaking fluid. She reports not having drank much water; however, she describes the fluid as clear, without odor. She took a shower so that she could see if she'd leak anymore. At the time of call, patient was at work, and had left home about 20-30 minutes prior. She went to the bathroom while on phone with RN, and states that she has leaked a little bit of fluid. Pt is concerned about pre-term labor. RN advised for patient to present to MAU for evaluation. Pt verbalized understanding.

## 2023-08-27 NOTE — Assessment & Plan Note (Deleted)
28 wks.

## 2023-08-30 ENCOUNTER — Ambulatory Visit: Payer: Self-pay | Admitting: Obstetrics and Gynecology

## 2023-08-31 ENCOUNTER — Encounter: Payer: Self-pay | Admitting: Obstetrics and Gynecology

## 2023-08-31 ENCOUNTER — Ambulatory Visit (INDEPENDENT_AMBULATORY_CARE_PROVIDER_SITE_OTHER): Payer: Self-pay | Admitting: Obstetrics and Gynecology

## 2023-08-31 ENCOUNTER — Other Ambulatory Visit

## 2023-08-31 VITALS — BP 122/66 | HR 95 | Wt 120.0 lb

## 2023-08-31 DIAGNOSIS — O26893 Other specified pregnancy related conditions, third trimester: Secondary | ICD-10-CM

## 2023-08-31 DIAGNOSIS — Z3A28 28 weeks gestation of pregnancy: Secondary | ICD-10-CM

## 2023-08-31 DIAGNOSIS — N898 Other specified noninflammatory disorders of vagina: Secondary | ICD-10-CM

## 2023-08-31 DIAGNOSIS — Z3400 Encounter for supervision of normal first pregnancy, unspecified trimester: Secondary | ICD-10-CM

## 2023-08-31 DIAGNOSIS — Z3A29 29 weeks gestation of pregnancy: Secondary | ICD-10-CM

## 2023-08-31 DIAGNOSIS — Z23 Encounter for immunization: Secondary | ICD-10-CM | POA: Diagnosis not present

## 2023-08-31 LAB — GC/CHLAMYDIA PROBE AMP (~~LOC~~) NOT AT ARMC
Chlamydia: NEGATIVE
Comment: NEGATIVE
Comment: NORMAL
Neisseria Gonorrhea: NEGATIVE

## 2023-08-31 MED ORDER — CLOTRIMAZOLE 1 % VA CREA: 1.0000 | TOPICAL_CREAM | Freq: Every day | VAGINAL | 2 refills | Status: DC

## 2023-08-31 NOTE — Patient Instructions (Addendum)
 This website provides an overview of breastfeeding: [email or web address]   [facility] Pediatric Providers  Central/[place]outheast [place] ([address]) [facility] Manson Passey, MD; Deirdre Priest, MD; Lum Babe, MD; Leveda Anna, MD; McDiarmid, MD; Jerene Bears, MD [address] [place] [place], [place] [address] [phone] Mon-Fri 8:30-12:30, 1:30-5:00  Providers come to see babies during newborn hospitalization Only accepting infants of Mother's who are seen at [facility] or have siblings seen at   [facility] Medicaid - Yes; Tricare - Yes   [facility] [place], MD [address], [place], [place] [address] [phone] Mon, Tue, Thur, Fri 8:30-5:00, Wed 10:00-7:00 (closed 1-2pm daily for lunch) [facility] residents with no insurance.  [place] [employer] only with Medicaid/insurance; Tricare - no  [facility]) - Tim [facility], MD; Manson Passey, MD; Ave Filter, MD; Luna Fuse, MD; Kennedy Bucker, MD; Florestine Avers, MD; Melchor Amour, MD; Yetta Barre,  MD; Konrad Dolores, MD; Kathlene November, MD; Jenne Campus, MD; Wynetta Emery, MD; Duffy Rhody, MD; Gerre Couch, NP [address] [place], [place], [place] [address] [phone] Mon, Tue, Thur, Fri 8:30-5:30, Wed 9:30-5:30, Sat 8:30-12:30 Only accepting infants of first-time parents or siblings of current patients Hospital discharge coordinator will make follow-up appointment Medicaid - yes; Tricare - yes  [place] [phone]) [facility] Ilean China, MD; Earlene Plater, MD; Jamesetta Orleans, MD; Alvera Novel, MD; Rana Snare, MD; [facility], MD; Shaaron Adler, MD; Hosie Poisson, MD; Mayford Knife, MD [address], [place], [place] [address] [phone] Mon-Fri 8:30-5:00, closed for lunch 12:30-1:30; Sat-Sun 10:00-1:00 Accepting Newborns with commercial insurance only, must call prior to delivery to be accepted into  practice.  Medicaid - no, Tricare - yes   [facility] [address] [place], [place]  [address] [phone] or [phone] Mon to Fri 8am to 10pm, Sat 8am to 1pm (virtual only on weekends) Only accepts Medicaid Healthy Blue pts  [facility] ([facility]) - Pediatrics at Elige Radon, MD; Sabino Dick, MD; Quitman Livings, MD; Betha Loa, NP; Claretha Cooper, MD; Lelon Perla, MD [address] [place] [place], [place] [address] [phone] Mon-Fri 8:30-5:30 Medicaid - yes, Tricare - yes  [place] [phone]) [facility] Marcie Mowers, MD [address], [place], [place] [address] [phone] Iona Hansen, Wed Fri 8:30-5:00, Sat 8:30-12:00, Closed Thursdays Accepting siblings of established patients and first time mom's if you call prenatally Medicaid- yes; Tricare - yes  [facility] Lutricia Feil, [place]; Tracie Harrier, MD; Rusty Aus; Scifres, PA; Wynelle Link, MD; Azucena Cecil, MD;  [address], [place], [place] [address] [phone] Mon-Fri 8:30-5:00, closed for lunch 1-2 Only accepting newborns of established patients Medicaid- no; Tricare - yes  [facility] [phone]) [facility] at Morene Crocker, MD; [address], [place], [place] [address] [phone] Mon-Fri 8:00-5:00 Medicaid - No; Tricare - Yes  [facility], [place]; [place], [place] [address], [place], [place] [address] [phone] Mon-Fri 8:00-5:00 Medicaid - No, Tricare - Yes  [facility] Cardell Peach, MD; Nash Dimmer, MD; [place], [place] [address] [place], [place] [address] [phone]  Mon-Fri 8:00-5:00 Medicaid - No; Tricare - Yes  [facility] [address], [place], [place] [address] [phone] Mon-Fri 8:30-5:00 (lunch 12:00-1:00) Medicaid -Yes; Tricare - Yes  [facility] [place], MD [address] [place], [place], [place] [address] [phone] Mon-Fri 8:00-5:00 Seeing newborns of current patients only. No new patients Medicaid -  No, Tricare - yes  Nature conservation officer at [place] [address] MD [address] [place], [place]  [address] [phone] Mon-Fri 8:00-5:00 Medicaid -yes as secondary coverage only; Tricare - yes  [facility] [place], [place]; [place], NP; Avis Epley, MD; Vonna Kotyk, MD; Clance Boll, MD; [place], [place]; [place], NP; Vaughan Basta, MD; [place], MD [address] [place], [place] [address] [phone] Mon-Fri 8:30-5:00, Sat 9:00-11:00 Accepts commercial insurance ONLY. Offers free prenatal information sessions for families. Medicaid - No, Tricare - Call first  [facility] [place], MD; [place], [place]; [place], [place]; [place], [place] [address] [place] [address] [phone] Mon-Fri 7:30-5:30 Medicaid - Yes; Ailene Rud yes  [place] [phone] & [phone])  [facility], MD [address], [place], [place] [address] [phone] Mon-Thur 8:00-6:00, closed for lunch 12-2, closed Fridays Medicaid - yes; Tricare - no  [facility] Dareen Piano, NP; Cyndia Bent, MD; [place], [place]; [place], [place] [address], [place], [place] [address] [phone] Mon-Fri 7:30-4:30 Medicaid - yes, Tricare - yes  [facility]  Juanito Doom, MD; Janene Harvey, NP; Vonita Moss, MD; Donn Pierini, NP [address], [place], [place] [address] [phone] Mon-Fri 8:30-5:00, closed for lunch 1-2, Sat 8:30-12:00 - sick visits only Providers come to see babies at [facility] Only accepting newborns of siblings and first time parents ONLY if who have met with office prior to delivery Medicaid -Yes; Tricare - yes  [facility] - [place], [place]; Spero Geralds, NP; Earlene Plater, MD; Lucretia Roers, MD:  [address], [place], [place] [address] [phone] Mon- Fri 8:00-5:00, Sat 9:00-12:00 - sick visits only Accepting siblings of established patients and first time mom/baby Medicaid - Yes; Tricare - yes Patients must  have vaccinations (baby vaccines)  [place] [phone] & [phone])  Adult nurse HealthCare at [facility] [address] [place], [place] [address] [phone] Mon-Fri 8:00-5:00 Medicaid - no; Tricare - yes  [facility] Zeba, MD; [place], [place]; [place], [place] [address], [place], [place] [address] [phone] Mon-Fri 8:00-5:00 Medicaid- yes; Tricare - yes  [facility] - Ardeen Jourdain, MD; Yetta Barre, NP; [place], [place] [address] [place], [place], [place] [address] [phone] Mon-Fri 8:00-5:00 Medicaid - Yes; Tricare - yes  [address] [place] [phone])  [facility] [place], [place]; [place], [place]; Eddie Candle, MD; Normand Sloop, MD; Ben Avon, NP; [place], DO; [place], [place]; Constance Goltz, MD; Ruthann Cancer, MD; Vear Clock, MD; [place], [place]; [place], [place]; [place], [place] [address] [facility] [address], [place], [place] [address] [phone] Mon-Fri 8:30-5:00, Sat 9:00-12:00 - sick only Please register online [email or web address] then schedule online or call office Medicaid-Yes; Tricare -yes  [facility] - Premier  D[place], MD; Romualdo Bolk, MD; [place], MD; [place], NP; [place], [place]; Antonietta Barcelona, MD; Mayford Knife, NP; Shelva Majestic, MD [address], [place], [place] [address] [phone] Mon-Fri 8:00-5:30, Sat&Sun by appointment (phones open at 8:30) Medicaid - Yes; Tricare - yes  [place] [phone] & [phone]) [facility] Mariel Aloe; [place], MD; Roger Shelter, MD; Arvilla Market, NP; [place], DO [address], [place], [place] [address] [phone] M-F 8:00 - 5:15, Sat/Sun 9-12 sick visits only Medicaid - No; Tricare - yes  [facility]  [place] Soapstone, PA-C; [place], PA-C; [place], DO; [place], PA-C; Truitt Leep; Roselyn Bering, MD [address], [place], [place] [address] [phone] Mon-Thur 8:00-7:00, Fri 8:00-5:00 Accepting Medicaid  for 13 and under only   [facility] - Family Medicine at [facility] [place]  [place] [place], [place]; List, FNP; Berneda Rose, MD; Luther Redo, PA-C; Lavonia Drafts, MD; Kellie Simmering, FNP; Genevie Cheshire, FNP; Evaristo Bury, MD; Berneda Rose, MD [phone] [address] [place], [place] [address] [phone] Mon-Fri 8:30-5:30 Medicaid - Yes; Tricare - yes  [facility] Pediatrics - [address]  [place], [place]; Whitney Post, MD; Hennie Duos, MD; Wynne Dust, MD; [place], NP [address], [place], [place] [address] [phone] Mon-Thur 8:00-5:30, Fri 8:00-5:00, Sat 9:00-12:00 Medicaid - yes, Tricare - yes  [facility] [phone])  [facility], [place]; Lenise Arena, MD; [place], [place] [address], [place], [place] [address] [phone] Mon-Fri 8:00-5:00, closed for lunch 12-1 Medicaid - No; Tricare - yes  Nature conservation officer at [facility], MD [address] [place], [place] [address] [phone] Mon-Fri 8:00-5:00 Medicaid - No; Tricare - yes  [facility], MD; Tami Ribas, MD; Mariam Dollar, MD; Yetta Barre, MD [address], [place], [place] [address] [phone] Mon-Fri 8:00-5:00 Medicaid- Yes; Tricare - yes  [place] [phone])  Adult nurse HealthCare at [facility], [place]; [place], MD [address] [place] [address] [place], [place], [place] [address] [phone] Mon-Fri 8:00-5:00 Medicaid - No; Tricare - yes  [facility] - Whitney Post - CPNP [address], [place], [place] [address] [phone] Mon-Weds 8:00-6:00, Thurs-Fri 8:00-5:00, Sat 9:00-12:00 Medicaid - yes; Tricare - yes   [facility] Katharina Caper, MD; [place], [place] [address] [place], [place] [address] [phone] Mon-Fri 8:00-5:00 Medicaid - yes; Tricare - yes  [facility] Providers  [facility] [address], [place], [place] [address] [phone] Sheral Flow: 8am -8pm, Tues, Weds: 8am - 5pm; Fri: 8-1 Medicaid - Yes; Tricare - yes  [facility] Rachel Bo, MD; Laural Benes, MD; Anner Crete, MD; [place], [place]; [place], [place] [address], [place], [place] [address] [phone] M-F 8:30 - 5:00 Medicaid - Call office; Tricare -yes  [facility] Edson Snowball, MD; Shanon Rosser, MD, Chelsea Primus, MD; Shirlyn Goltz, PNP; Wardell Heath, NP [phone] [address], [place], [place] [address] [phone] M-F 8:30 - 5:00, Sat/Sun 8:30 - 12:30 (sick visits) Medicaid - Call office; Tricare -yes  [facility] Melvyn Neth, MD; Karl Luke, PNP; Princess Bruins, MD; [place], [place]; [place], NP; Cynda Familia [address], [place], [place] [address] [phone] M-F 8:30 - 5:00 Medicaid - Call office; Tricare - yes  [facility] Jesusita Oka, MD; Dierdre Highman, MD; Earnest Conroy, MD; Timothy Lasso, MD; [place], MD [phone] [address], [place], [place] [address] [phone] M-Thur: 8:00 - 5:00; Fri: 8:00 - 4:00 Medicaid - yes; Tricare - yes  [facility] [address] [place], [place] [address] [phone] M-F: 8:30- 5:00, closed for lunch 12:30 - 1:00 Medicaid - yes; Tricare -yes  [facility] [address] [place], [place] [address] [phone] M-F 8:00 - 5:00 Medicaid - yes; Tricare - yes  [facility] Cunard, DO; [place], DO; [place], NP [address], [place], [place] [address] [phone] M-F 8:00 - 5:00, Closed 12-1 for lunch Medicaid - Call; Tricare - yes  [facility] Pediatrics Meredith Mody, MD [address] [place], [place] [address] [phone] M-F: 8:00-5:00, Sat: 8:00 - noon Medicaid - call; Tricare -yes  [facility] Pediatric Providers  [facility] [place], [place] [address] [place], [place], [place] [address] [phone] M-W: 8:00-5:00, Thur: 8:00 - 7:00, Fri: 8:00 - noon Medicaid - yes; Tricare - yes  [place]  Family Medicine - Quay Burow, FNP [address], [place],  [place] [address] [phone] M-F 8:00 - 5:00, Closed for lunch 12-1 Medicaid - yes; Tricare - yes  [facility] Pediatric Providers  [facility] at [facility], [place], Alinda Money, MD, [place], FNP-C [address] [facility], [address], [place], [place] [address] [phone] M-T 8:00-5:00, Wed-Fri 7:00-6:00 Medicaid - Yes; Tricare -yes  [facility] Family Medicine at [facility], [place]; [address] Saner [place], [place] [address] [phone] M-F 8:00 - 5:00, closed for lunch 12-1 Medicaid - Yes; Tricare - yes  [facility] Pediatrics and Internal Medicine  Zachery Dauer, MD; Gladstone Lighter, MD; Collie Siad, MD; Freda Jackson, MD; Rich Number, MD; Darryl Nestle, MD; Melinda Crutch, MD, Audria Nine, MD; Tawanna Cooler, MD; Steffanie Dunn, MD; Byrd Hesselbach, MD; Lucretia Roers, MD [address], [place], [place] [address] [phone] M-F 8:00-5:00 Medicaid - yes; Tricare - yes  [facility] [place], MD (speaks [place] and Hindi) [address] [place], [place] [address] [phone] M-F: 8:30 - 5:00, closed 12:30 - 1 for lunch Medicaid - Yes; Tricare -yes  [facility] Pediatric Providers  [facility] Shanda Bumps, MD; Chanetta Marshall, MD; Laurell Josephs, MD [address], [place], [place] [address] [phone] M-Th: 8:00 - 5:30, Fri: 8:00 - 12:00 Medicaid - yes; Tricare - yes  [facility] Pediatrics at [facility], NP; Thora Lance, MD; Orrin Brigham, MD [phone] [address], [place], [place] [address] [phone] M-F: 8:00 - 5:00 Medicaid - yes; Tricare - yes  [facility] [place], NP; Orson Slick, NP; Salley Scarlet, NP; Linton Flemings, MD; Mayford Knife, MD, [place], NP, Emelda Fear, MD; Nida Boatman [address] [place], [place] [address] [phone] M-F: 8:30 - 5:30p Medicaid - yes; Tricare - yes Other locations available as well  [facility], MD; Andrey Campanile, MD; Nev[address], PA-C [address] [place], [place]  [address] [phone] M-W: 8:00am - 7:00pm, Thurs: 8:00am - 8:00pm; Fri: 8:00am - 5:00pm, closed daily from 12-1 for lunch Medicaid - yes; Tricare - yes  [facility] Pediatric Providers  [facility] at Levin Erp, MD; Aggie Cosier, FNP; Bland Span, MD; Tristan Schroeder, MD; [place], PNP; Alesia Banda; [place], [place]; Julian Reil, MD;  [address], [place], [place] [address] [phone] Judie Petit - Caleen Essex: 8am - 5pm, Sat 9-noon Medicaid - Yes; Tricare -yes  [facility] at Jaclynn Guarneri, MD; Yetta Barre, FNP; Lilian Kapur, MD; Mariam Dollar, MD [address] [place], [place] [phone] M-F 8:00 - 5:00 Medicaid - call; Tricare - yes  [facility]- Cruz Condon, MD; [place], [place]; Delora Fuel, MD; Dareen Piano, MD; Trudee Grip, MD; Kizzie Ide, MD; Zebedee Iba; Birdena Crandall, MD; Hinton Dyer, MD; [place], MD [address], [place], [place] [address] [phone] M-F 8:00am - 5:00pm; Sat. 9:00 - 11:00 Medicaid - yes; Tricare - yes  [facility] at [facility], MD [address], [place], [place] [address] [phone] M-F 8:00 - 5:00 Medicaid - [place] Medicaid only; Tricare - yes  [facility]s - Illene Bolus, MD; Earlene Plater, [place]; Kenyon Ana, MD [address], [place], [place] [address] [phone] M-F 8:00 - 5:00 Medicaid - yes; Tricare - yes  [facility] [address] Pediatrics - Lind Covert, MD; Manson Passey, MD, [facility], MD, Howe, MD; [place]-Laager, MD; Katrinka Blazing, MD; [address] [place], [place] [address] [phone] M-F: 8-5 Medicaid - yes; Tricare - yes  [facility] - Henrietta Hoover, [place]; [place], MD; [address] [place], [place] [address] [phone] M-F 8-5 Medicaid - yes; Tricare - yes  [address] Darrol Poke, MD; Tami Ribas, MD; [place], MD; [place], DNP; [place], PNP [address], [place], [place] [address] [phone] M-F 8-5 Medicaid - yes; Tricare -  yes  [facility] and Pediatrics Delories Heinz, MD; Adrienne Mocha; Ala Bent, MD [address] [place], [place] [address] [phone] M-F 7am - 5 pm Medicaid - call; Tricare - yes  [facility] [place], [place]; Fredia Beets, MD; Roxan Hockey, MD [address] [place], [place] [address] [phone] M-F 8-5 Medicaid - yes; Tricare - yes  [facility] Kae Heller, MD; Sheliah Hatch, MD; Mayford Knife, FNP; Shon Baton, FNP; Tyron Russell, FNP; Ishmael Holter; [facility] - FNP [address], [place], [place] [address] [phone] M-F 8-5 Medicaid- yes; Tricare - yes  [facility] - Betsy Coder, Lively and [place] Guest, MD; Terrial Rhodes, MD; Hulda Humphrey, MD; Roseanne Reno, MD; [place], [place]; Ala Dach, MD; Fredia Beets, MD; Dimple Casey, MD [address] [place], [place] [address] [phone] M-F: 8-5, Sat: 9-4, Sun 9-12 Medicaid - yes; Tricare - yes  [facility] Little, PNP; Earlene Plater, PNP [address] [place], [place] [address] [phone] M-F 8 - 5, closed 12-1 for lunch Medicaid - yes; Tricare - yes  [facility] Pediatrics Kathyrn Lass, MD; Hal Neer, MD; Dimple Casey, MD; Northern [place], DO [address], [place], [place] [address] [phone] M-F 8- 5:30 Medicaid - yes; Tricare - yes  [facility]s - Biagio Quint, MD; Rosalia Hammers, MD; Gwenith Daily, MD [address], [place], [place] [address] [phone] Judie Petit: Nicholas Lose; Tues-Fri: 8-5; Sat: 9-12 Medicaid - yes; Tricare - yes  [facility] - Bobbye Morton, MD; Daphane Shepherd, MD; Chestine Spore, MD; Haskell Riling, MD; Kate Sable, MD [address], [place], [place] [address] [phone] Judie PetitMarland Kitchen Nicholas LoseFrancee Nodal: 8-5; Sat: 8:30-12:30 Medicaid - yes; Tricare - yes  [facility] Heck[facility] Pediatrics - Beckey Rutter, MD; [place],  [place] [address] [place], [place] [address] [phone] Mon-Fri: 8-5 Medicaid - yes; Tricare - yes  [facility] - [place], CPNP; [place], [place]; Dimple Casey, MD; Alisa Graff, MD; Cephus Shelling, MD; [address], [place], [place] [address] [phone] M-F: 8-5, closed 1-2 for lunch Medicaid - yes; Tricare - yes  [facility]arnelle [facility] Pediatrics - [facility] [place], [place]; [place], [place]; Katrinka Blazing, MD; [place], CPNP; [place], [place]; [place], MD; Earlene Plater, MD [address], [place], [place] [address] [phone] M-Thurs: Nicholas Lose; Fri: 8-6; Sat: 9-12; Sun 2-4 Medicaid - yes; Tricare - yes  [facility] Georgeanna Lea, MD; Evette Cristal, MD; Shea Stakes, FNP; Earney Mallet, DO; [address], [place], [place] [address] [phone] M-F: 8-5 Medicaid - yes; Tricare - yes  [facility] Pediatric Providers  [facility] Family Medicine -Collene Mares, MD; [place], NP [address], [place], [place] [address] [phone] M - Fri: 8am - 5pm, closed for lunch 12-1 Medicaid - Yes; Tricare - yes  [facility] and Pediatrics Elinor Parkinson, MD; Victory Dakin, MD; [place]anger, DO; Vinocur, MD;Hall, PA; Clent Ridges, [place]; Orvan Falconer, NP [phone] [address], [place], [place] [address] [phone] M-F 8:00 - 5:00, Sat 8:00 - 11:30 Medicaid - yes; Tricare - yes  [facility] Welton Flakes, MD; House, MD, [address] MD, Kimberly, MD, Ruby, MD; [place], NP; [place], [place];  [address], [place], [place] [address] [phone] M-F 8:10am -  5:00pm Medicaid - yes; Tricare - yes  [facility] Converse, MD; [place], NP [address] [place], [place] [address] [phone] M-F 8:00 - 5:00 Medicaid - [place] Medicaid only; Tricare - yes  [facility] Family Medicine - Deep [address] [place], [place]; [place], NP [address] [place],  [place], [place] [address] [phone] M-F 8:00 - 5:00; Closed for lunch 12 - 1:00 Medicaid - yes; Tricare - yes  [facility] Belva Crome, MD; Jonita Albee, FNP [address], [place], [place] [address] [phone] Mon 9-5; Tues/Wed 10-5; Thurs 8:30-5; Fri: 8-12:30 Medicaid - yes; Tricare - yes  [facility] Pediatric Providers  [facility]  [place], MD; [place], [place] [address] [place], [place] [address] [phone] phone [phone] fax M-F 7:15 - 4:30 Medicaid - yes; Tricare - yes  [facility] Karilyn Cota, MD; [place], DO [address], [place], [place] [address] [phone] M-Fri: 8:30 - 5:00, closed for lunch everyday noon - 1pm Medicaid - Yes; Tricare - yes  [facility] Burdine, MD; Reuel Boom, MD; Dimas Aguas, MD; Neita Carp, MD; [place], [place]; Bonnita Nasuti, [place]; [place], [place]; [place], [place]; [place], [place] [address] [place], [place] [address] [phone] M-Thurs: 7:30am - 7:00pm; Friday 7:30am - 4pm; Sat: 8:00 - 1:00 Medicaid - Yes; Tricare - yes  [facility] Norval Morton, MD; Conni Elliot, MD; Carroll Kinds, MD; [place], DO [address], [place], [place] [address] [phone] M-Thur: 8:00 - 5:00, Fri: 8:00 - Noon Medicaid - yes; Tricare - yes No [place] [employer]   [facility] Family Medicine Dettinger, MD; Nadine Counts, DO; [place], NP; Daphine Deutscher, NP; Lequita Halt, NP; Ellamae Sia, NP; Reginia Forts, NP; Darlyn Read, MD; [place], [place] [address] [address] [place], [place] [address] [phone] M-F 8:00 - 5:00 Medicaid - yes; Tricare - yes  [facility], FNP-C; [place], FNP-C [address] [place], [place] [address] [phone] M, W, R 8:00-5:00, Tues: 8:00am - 7:00pm; Fri 8:00 - noon Medicaid - Yes; Tricare - yes  [facility], MD [address] [place], [place] [address] [phone]  M-Thurs 8:30-5:30, Fri: 8:30-12:30pm Medicaid - Yes;  Tricare - N

## 2023-08-31 NOTE — Progress Notes (Signed)
   PRENATAL VISIT NOTE  Subjective:  Tina Castaneda is a 18 y.o. G2P0010 at [redacted]w[redacted]d being seen today for ongoing prenatal care.  She is currently monitored for the following issues for this high-risk pregnancy and has At risk for tuberculosis; Suicide ideation; Severe recurrent major depression without psychotic features (HCC); Tylenol  overdose; Suicide attempt [facility]); MDD (major depressive disorder), severe (HCC); Vomiting complicating pregnancy; Underweight; Encounter for supervision of normal pregnancy in teen primigravida, antepartum; [redacted] weeks gestation of pregnancy; and Vaginal discharge during pregnancy in third trimester on their problem list.  Patient reports vaginal irritation.  Contractions: Irritability. Vag. Bleeding: None.  Movement: Present. Denies leaking of fluid.   The following portions of the patient's history were reviewed and updated as appropriate: allergies, current medications, past family history, past medical history, past social history, past surgical history and problem list.   Objective:    Vitals:   08/31/23 0936  BP: 122/66  Pulse: 95  Weight: 120 lb (54.4 kg)    Fetal Status:  Fetal Heart Rate (bpm): 144   Movement: Present    General: Alert, oriented and cooperative. Patient is in no acute distress.  Skin: Skin is warm and dry. No rash noted.   Cardiovascular: Normal heart rate noted  Respiratory: Normal respiratory effort, no problems with respiration noted  Abdomen: Soft, gravid, appropriate for gestational age.  Pain/Pressure: Present     Pelvic: Cervical exam deferred        Extremities: Normal range of motion.  Edema: Trace  Mental Status: Normal mood and affect. Normal behavior. Normal judgment and thought content.   Assessment and Plan:  Pregnancy: G2P0010 at [redacted]w[redacted]d  1. [redacted] weeks gestation of pregnancy (Primary) - Glucose Tolerance, 2 Hours w/1 Hour - CBC - HIV antibody (with reflex) - RPR - gave info for pediatricians,  discussed contraception,  - Tdap  2. Vaginal discharge during pregnancy in third trimester Reports still having irritation, requesting cream  3. Encounter for supervision of normal pregnancy in teen primigravida, antepartum Last growth normal limits  4. [redacted] weeks gestation of pregnancy   Preterm labor symptoms and general obstetric precautions including but not limited to vaginal bleeding, contractions, leaking of fluid and fetal movement were reviewed in detail with the patient. Please refer to After Visit Summary for other counseling recommendations.   Return in about 2 weeks (around 09/14/2023) for high OB.  Future Appointments  Date Time Provider Department Center  09/07/2023  8:15 AM [place], [place] LABOR, CNM [facility] None    Burnard CHRISTELLA Moats, MD

## 2023-08-31 NOTE — Progress Notes (Signed)
 Pt presents for rob. Pt is still having symptoms of yeast and would like the cream instead of the pill.

## 2023-08-31 NOTE — Addendum Note (Signed)
 Addended by: ALVIA ROSINA GAILS on: 08/31/2023 10:51 AM   Modules accepted: Orders

## 2023-09-01 ENCOUNTER — Ambulatory Visit: Payer: Self-pay | Admitting: Obstetrics and Gynecology

## 2023-09-01 ENCOUNTER — Telehealth: Payer: Self-pay

## 2023-09-01 ENCOUNTER — Other Ambulatory Visit: Payer: Self-pay

## 2023-09-01 LAB — CBC
Hematocrit: 28.7 % — ABNORMAL LOW (ref 34.0–46.6)
Hemoglobin: 9 g/dL — ABNORMAL LOW (ref 11.1–15.9)
MCH: 28.8 pg (ref 26.6–33.0)
MCHC: 31.4 g/dL — ABNORMAL LOW (ref 31.5–35.7)
MCV: 92 fL (ref 79–97)
Platelets: 263 10*3/uL (ref 150–450)
RBC: 3.12 x10E6/uL — ABNORMAL LOW (ref 3.77–5.28)
RDW: 13.4 % (ref 11.7–15.4)
WBC: 9.7 10*3/uL (ref 3.4–10.8)

## 2023-09-01 LAB — GLUCOSE TOLERANCE, 2 HOURS W/ 1HR
Glucose, 1 hour: 108 mg/dL (ref 70–179)
Glucose, 2 hour: 112 mg/dL (ref 70–152)
Glucose, Fasting: 77 mg/dL (ref 70–91)

## 2023-09-01 LAB — HIV ANTIBODY (ROUTINE TESTING W REFLEX): HIV Screen 4th Generation wRfx: NONREACTIVE

## 2023-09-01 LAB — RPR: RPR Ser Ql: NONREACTIVE

## 2023-09-01 MED ORDER — TERCONAZOLE 0.8 % VA CREA
1.0000 | TOPICAL_CREAM | Freq: Every day | VAGINAL | 0 refills | Status: AC
Start: 2023-09-01 — End: ?

## 2023-09-01 NOTE — Telephone Encounter (Signed)
 Attempted to contact about mychart message regarding rx, no answer, left vm

## 2023-09-01 NOTE — Telephone Encounter (Signed)
 Returned call, pt wanting to know next steps after seeing cbc results. Advised that provider has not reviewed, office will follow back up

## 2023-09-01 NOTE — Progress Notes (Signed)
 Pt returned call, and stated that she requested refill on terconazole  cream at visit yesterday instead of alternative. Advised refill would be sent per protocol.

## 2023-09-02 ENCOUNTER — Encounter (HOSPITAL_COMMUNITY): Payer: Self-pay | Admitting: Obstetrics & Gynecology

## 2023-09-02 ENCOUNTER — Telehealth: Payer: Self-pay

## 2023-09-02 ENCOUNTER — Inpatient Hospital Stay (HOSPITAL_COMMUNITY)
Admission: AD | Admit: 2023-09-02 | Discharge: 2023-09-02 | Disposition: A | Discharge status: Home / Self Care | Payer: Self-pay | Attending: Obstetrics & Gynecology | Admitting: Obstetrics & Gynecology

## 2023-09-02 DIAGNOSIS — Z3400 Encounter for supervision of normal first pregnancy, unspecified trimester: Secondary | ICD-10-CM

## 2023-09-02 DIAGNOSIS — O4703 False labor before 37 completed weeks of gestation, third trimester: Secondary | ICD-10-CM

## 2023-09-02 DIAGNOSIS — Z3A29 29 weeks gestation of pregnancy: Secondary | ICD-10-CM

## 2023-09-02 DIAGNOSIS — O26853 Spotting complicating pregnancy, third trimester: Secondary | ICD-10-CM | POA: Insufficient documentation

## 2023-09-02 LAB — URINALYSIS, ROUTINE W REFLEX MICROSCOPIC
Bilirubin Urine: NEGATIVE
Glucose, UA: 50 mg/dL — AB
Hgb urine dipstick: NEGATIVE
Ketones, ur: 5 mg/dL — AB
Leukocytes,Ua: NEGATIVE
Nitrite: NEGATIVE
Protein, ur: NEGATIVE mg/dL
Specific Gravity, Urine: 1.026 (ref 1.005–1.030)
pH: 6 (ref 5.0–8.0)

## 2023-09-02 LAB — FETAL FIBRONECTIN: Fetal Fibronectin: NEGATIVE

## 2023-09-02 MED ORDER — NIFEDIPINE 10 MG PO CAPS
10.0000 mg | ORAL_CAPSULE | ORAL | Status: DC | PRN
  Filled 2023-09-02: qty 1

## 2023-09-02 NOTE — MAU Provider Note (Signed)
 Chief Complaint:  Contractions   Event Date/Time   First Provider Initiated Contact with Patient 09/02/23 2039     HPI  HPI: Tina Castaneda is a 18 y.o. G2P0010 at 56w4dwho presents to maternity admissions reporting back pain, pelvic pressure and pink spotting yesterday. . She reports good fetal movement, denies LOF, urinary symptoms, n/v, diarrhea, constipation or fever/chills.  .  RN Note:  Tina Castaneda is a 18 y.o. at [redacted]w[redacted]d here in MAU reporting ctxs since yesterday. Very irregular. Feels like a lot of pressure in lower abd. Had some pink spotting yesterday. No recent intercourse. No LOF or VB today. Reports good FM. Some lower back pain. Ctxs feel like tightness but occ sharp pain in lower abd. No pain now    Past Medical History: Past Medical History:  Diagnosis Date   Anemia    Anxiety    Asthma    Depression     Past obstetric history: OB History  Gravida Para Term Preterm AB Living  2 0 0 0 1 0  SAB IAB Ectopic Multiple Live Births  0 1 0 0 0    # Outcome Date GA Lbr Len/2nd Weight Sex Type Anes PTL Lv  2 Current           1 IAB             Past Surgical History: Past Surgical History:  Procedure Laterality Date   WISDOM TOOTH EXTRACTION      Family History: Family History  Problem Relation Age of Onset   Asthma Sister    Asthma Maternal Grandmother     Social History: Social History   Tobacco Use   Smoking status: Never    Passive exposure: Never   Smokeless tobacco: Never  Vaping Use   Vaping status: Never Used  Substance Use Topics   Alcohol use: Not Currently   Drug use: Not Currently    Types: Marijuana    Allergies: No Known Allergies  Meds:  Medications Prior to Admission  Medication Sig Dispense Refill Last Dose/Taking   Prenatal Vit-Fe Phos-FA-Omega (VITAFOL  GUMMIES) 3.33-0.333-34.8 MG CHEW Chew 3 tablets by mouth daily. 90 tablet 11 09/02/2023   clotrimazole  (GYNE-LOTRIMIN ) 1 % vaginal cream Place  1 Applicatorful vaginally at bedtime for 7 days. 30 g 2    ferrous sulfate  325 (65 FE) MG tablet Take 1 tablet (325 mg total) by mouth daily with breakfast. (Patient not taking: Reported on 08/31/2023) 30 tablet 3    fluconazole  (DIFLUCAN ) 150 MG tablet Take 1 tablet (150 mg total) by mouth daily. (Patient not taking: Reported on 08/31/2023) 1 tablet 1    terconazole  (TERAZOL 3 ) 0.8 % vaginal cream Place 1 applicator vaginally at bedtime. Apply nightly for three nights. 20 g 0     I have reviewed patient's Past Medical Hx, Surgical Hx, Family Hx, Social Hx, medications and allergies.   ROS:  Review of Systems  Constitutional:  Negative for chills and fever.  Genitourinary:  Positive for pelvic pain. Negative for dysuria, flank pain and vaginal bleeding (pink yesterday).  Musculoskeletal:  Positive for back pain.   Other systems negative  Physical Exam  Patient Vitals for the past 24 hrs:  BP Temp Pulse Resp SpO2 Height Weight  09/02/23 2002 -- -- -- -- 100 % -- --  09/02/23 1948 121/73 -- (!) 111 -- -- -- --  09/02/23 1927 113/62 -- -- -- -- -- --  09/02/23 1924 -- 98.2 F (36.8 C)  97 17 100 % 5' 3 (1.6 m) 55.3 kg   Constitutional: Well-developed, well-nourished female in no acute distress.  Cardiovascular: normal rate and rhythm Respiratory: normal effort GI: Abd soft, non-tender, gravid appropriate for gestational age.   No rebound or guarding. MS: Extremities nontender, no edema, normal ROM Neurologic: Alert and oriented x 4.   PELVIC EXAM: Dilation: Closed Effacement (%): 30 Station: Ballotable Exam by:: Earnie Pouch, CNM  Fetal fibronectin collected  FHT:  Baseline 145 , moderate variability, accelerations present, no decelerations Contractions: Irregular    Labs: Results for orders placed or performed during the hospital encounter of 09/02/23 (from the past 24 hours)  Urinalysis, Routine w reflex microscopic -Urine, Clean Catch     Status: Abnormal   Collection Time:  09/02/23  7:25 PM  Result Value Ref Range   Color, Urine YELLOW YELLOW   APPearance CLEAR CLEAR   Specific Gravity, Urine 1.026 1.005 - 1.030   pH 6.0 5.0 - 8.0   Glucose, UA 50 (A) NEGATIVE mg/dL   Hgb urine dipstick NEGATIVE NEGATIVE   Bilirubin Urine NEGATIVE NEGATIVE   Ketones, ur 5 (A) NEGATIVE mg/dL   Protein, ur NEGATIVE NEGATIVE mg/dL   Nitrite NEGATIVE NEGATIVE   Leukocytes,Ua NEGATIVE NEGATIVE  Fetal fibronectin     Status: None   Collection Time: 09/02/23  8:47 PM  Result Value Ref Range   Fetal Fibronectin NEGATIVE NEGATIVE    O/Positive/-- (04/08 1017)  Imaging:    MAU Course/MDM: I have reviewed the triage vital signs and the nursing notes.   Pertinent labs & imaging results that were available during my care of the patient were reviewed by me and considered in my medical decision making (see chart for details).      I have reviewed her medical records including past results, notes and treatments.   I have ordered labs and reviewed results. Fetal fibronectin negative.  DIscussed this means lower chance of delivering in the next 2 weeks.  NST reviewed  Treatments in MAU included Procardia  series offered but patient declined. .    Assessment: Single IUP at [redacted]w[redacted]d Threatened preterm labor  Plan: Discharge home Preterm Labor precautions and fetal kick counts Follow up in Office for prenatal visits and recheck Encouraged to return if she develops worsening of symptoms, increase in pain, fever, or other concerning symptoms.   Pt stable at time of discharge.  Earnie Pouch CNM, MSN Certified Nurse-Midwife 09/02/2023 8:39 PM

## 2023-09-02 NOTE — Telephone Encounter (Signed)
 Rturned call and pt stated that last night she was feeling contractions with intense back and pelvic pressure. Pt states that pressure caused her to sit on the toilet and she thought she was having a bowel movement. Pt states that she had another contraction about an hour ago. Denies vaginal bleeding, leaking fluid, and states that no contractions or pressure at this time. Advised of signs of preterm labor, maternity belt, increased water intake, and advised pt that if the pressure becomes that intense again to be evaluated at [facility], pt agreed.

## 2023-09-02 NOTE — MAU Note (Signed)
 Tina Castaneda is a 18 y.o. at [redacted]w[redacted]d here in MAU reporting ctxs since yesterday. Very irregular. Feels like a lot of pressure in lower abd. Had some pink spotting yesterday. No recent intercourse. No LOF or VB today. Reports good FM. Some lower back pain. Ctxs feel like tightness but occ sharp pain in lower abd. No pain now  LMP: n/a Onset of complaint: yesterday Pain score: 0 Vitals:   09/02/23 1924 09/02/23 1927  BP:  113/62  Pulse: 97   Resp: 17   Temp: 98.2 F (36.8 C)   SpO2: 100%      FHT: 165  Lab orders placed from triage: u/a

## 2023-09-07 ENCOUNTER — Other Ambulatory Visit: Payer: Self-pay

## 2023-09-07 ENCOUNTER — Other Ambulatory Visit: Payer: Self-pay | Admitting: Obstetrics and Gynecology

## 2023-09-07 ENCOUNTER — Encounter: Payer: Self-pay | Admitting: Advanced Practice Midwife

## 2023-09-07 ENCOUNTER — Inpatient Hospital Stay (HOSPITAL_COMMUNITY)
Admission: AD | Admit: 2023-09-07 | Discharge: 2023-09-07 | Disposition: A | Discharge status: Home / Self Care | Payer: Self-pay | Attending: Obstetrics and Gynecology | Admitting: Obstetrics and Gynecology

## 2023-09-07 ENCOUNTER — Encounter (HOSPITAL_COMMUNITY): Payer: Self-pay | Admitting: Obstetrics and Gynecology

## 2023-09-07 ENCOUNTER — Encounter: Payer: Self-pay | Admitting: Obstetrics and Gynecology

## 2023-09-07 DIAGNOSIS — R202 Paresthesia of skin: Secondary | ICD-10-CM | POA: Insufficient documentation

## 2023-09-07 DIAGNOSIS — R102 Pelvic and perineal pain: Secondary | ICD-10-CM | POA: Diagnosis not present

## 2023-09-07 DIAGNOSIS — Z3A30 30 weeks gestation of pregnancy: Secondary | ICD-10-CM | POA: Diagnosis not present

## 2023-09-07 DIAGNOSIS — R200 Anesthesia of skin: Secondary | ICD-10-CM | POA: Insufficient documentation

## 2023-09-07 DIAGNOSIS — O26899 Other specified pregnancy related conditions, unspecified trimester: Secondary | ICD-10-CM

## 2023-09-07 DIAGNOSIS — O368130 Decreased fetal movements, third trimester, not applicable or unspecified: Secondary | ICD-10-CM | POA: Insufficient documentation

## 2023-09-07 DIAGNOSIS — Z3400 Encounter for supervision of normal first pregnancy, unspecified trimester: Secondary | ICD-10-CM

## 2023-09-07 DIAGNOSIS — O26893 Other specified pregnancy related conditions, third trimester: Secondary | ICD-10-CM

## 2023-09-07 DIAGNOSIS — Z3689 Encounter for other specified antenatal screening: Secondary | ICD-10-CM | POA: Insufficient documentation

## 2023-09-07 DIAGNOSIS — M792 Neuralgia and neuritis, unspecified: Secondary | ICD-10-CM | POA: Insufficient documentation

## 2023-09-07 LAB — WET PREP, GENITAL
Clue Cells Wet Prep HPF POC: NONE SEEN
Sperm: NONE SEEN
Trich, Wet Prep: NONE SEEN
WBC, Wet Prep HPF POC: <10 (ref <10)
Yeast Wet Prep HPF POC: NONE SEEN

## 2023-09-07 MED ORDER — ACETAMINOPHEN 500 MG PO TABS: 1000.0000 mg | ORAL_TABLET | Freq: Four times a day (QID) | ORAL | 1 refills | Status: AC | PRN

## 2023-09-07 MED ORDER — PREPLUS 27-1 MG PO TABS: 1.0000 | ORAL_TABLET | Freq: Every day | ORAL | 13 refills | Status: AC

## 2023-09-07 MED ORDER — CYCLOBENZAPRINE HCL 10 MG PO TABS: 10.0000 mg | ORAL_TABLET | Freq: Three times a day (TID) | ORAL | 0 refills | Status: AC | PRN

## 2023-09-07 NOTE — Progress Notes (Signed)
 Sent new Rx for prenatal vitamins to pharmacy

## 2023-09-07 NOTE — MAU Provider Note (Addendum)
 MAU Provider Note  Chief Complaint: Decreased Fetal Movement, Visual disturbances, and Pelvic Pressure   Event Date/Time   First Provider Initiated Contact with Patient 09/07/23 1719      SUBJECTIVE HPI: Tina Castaneda is a 18 y.o. G2P0010 at [redacted]w[redacted]d by LMP who presents to maternity admissions reporting decreased fetal movement, pelvic pressure, and numbness/tingling/weakness in the BLE. This morning she noted DFM and increased pelvic pressure. Now, she is experiencing normal fetal movement but persistent fetal pressure. Notes infrequent contractions. Did not feel the contraction on the toco during our encounter. She has had several days of BLE diffuse numbness, tingling, and weakness. She feels like she may trip when she walks, but she has not fallen due to weakness. No vaginal bleeding or discharge.   Past Medical History:  Diagnosis Date   Anemia    Anxiety    Asthma    Depression    Past Surgical History:  Procedure Laterality Date   WISDOM TOOTH EXTRACTION     Social History   Socioeconomic History   Marital status: Single    Spouse name: Not on file   Number of children: Not on file   Years of education: Not on file   Highest education level: Not on file  Occupational History   Not on file  Tobacco Use   Smoking status: Never    Passive exposure: Never   Smokeless tobacco: Never  Vaping Use   Vaping status: Never Used  Substance and Sexual Activity   Alcohol use: Not Currently   Drug use: Not Currently    Types: Marijuana   Sexual activity: Yes    Birth control/protection: None  Other Topics Concern   Not on file  Social History Narrative   Lives at home with maternal grandmother. Per pt grandmother is legal guardian. No pets or smoking in the home.   Social Drivers of Corporate investment banker Strain: Low Risk  (03/22/2023)   Received from [facility]   Overall Financial Resource Strain (CARDIA)    Difficulty of Paying Living Expenses:  Not hard at all  Food Insecurity: No Food Insecurity (09/02/2023)   Hunger Vital Sign    Worried About Running Out of Food in the Last Year: Never true    Ran Out of Food in the Last Year: Never true  Transportation Needs: No Transportation Needs (09/02/2023)   PRAPARE - Administrator, Civil Service (Medical): No    Lack of Transportation (Non-Medical): No  Physical Activity: Insufficiently Active (03/22/2023)   Received from [facility]   Exercise Vital Sign    On average, how many days per week do you engage in moderate to strenuous exercise (like a brisk walk)?: 4 days    On average, how many minutes do you engage in exercise at this level?: 20 min  Stress: No Stress Concern Present (03/22/2023)   Received from [facility] - Occupational Stress Questionnaire    Feeling of Stress : Only a little  Social Connections: Socially Integrated (03/22/2023)   Received from [facility]   Social Network    How would you rate your social network (family, work, friends)?: Good participation with social networks  Intimate Partner Violence: Not At Risk (09/02/2023)   Humiliation, Afraid, Rape, and Kick questionnaire    Fear of Current or Ex-Partner: No    Emotionally Abused: No    Physically Abused: No    Sexually Abused: No  No current facility-administered medications on file prior to encounter.   Current Outpatient Medications on File Prior to Encounter  Medication Sig Dispense Refill   clotrimazole  (GYNE-LOTRIMIN ) 1 % vaginal cream Place 1 Applicatorful vaginally at bedtime for 7 days. 30 g 2   ferrous sulfate  325 (65 FE) MG tablet Take 1 tablet (325 mg total) by mouth daily with breakfast. (Patient not taking: Reported on 08/31/2023) 30 tablet 3   fluconazole  (DIFLUCAN ) 150 MG tablet Take 1 tablet (150 mg total) by mouth daily. (Patient not taking: Reported on 08/31/2023) 1 tablet 1   terconazole  (TERAZOL 3 ) 0.8 % vaginal cream Place 1  applicator vaginally at bedtime. Apply nightly for three nights. 20 g 0   No Known Allergies  ROS:  Pertinent positives/negatives listed above.  I have reviewed patient's Past Medical Hx, Surgical Hx, Family Hx, Social Hx, medications and allergies.   Physical Exam  Patient Vitals for the past 24 hrs:  BP Temp Temp src Pulse Resp SpO2  09/07/23 1701 108/65 -- -- (!) 107 -- 98 %  09/07/23 1649 107/65 98.6 F (37 C) Oral (!) 112 19 99 %   Constitutional: Well-developed, well-nourished female in no acute distress  Cardiovascular: tachycardic, no m/r/g Respiratory: normal effort, CTAB GI: Abd soft, non-tender Lumbar: minimal TTP over the lumbosacral musculature, no specific tenderness, normal ROM, 4/5 with hip flexion, ankle dorsiflexion and plantar flexion, (-) SLR bilaterally Neurologic: Alert and oriented x 4    Bimanual exam: Cervix ft/thick; performed by Olam Dalton NP  FHT:  Baseline 140 , moderate variability, accelerations present, no decelerations Contractions: 1 during 1 hour of monitoring  LAB RESULTS No results found for this or any previous visit (from the past 24 hours).  O/Positive/-- (04/08 1017)  IMAGING US  MFM OB FOLLOW UP Result Date: 08/23/2023 ----------------------------------------------------------------------  OBSTETRICS REPORT                       (Signed Final 08/23/2023 03:22 pm) ---------------------------------------------------------------------- Patient Info  ID #:       [id number]                          D.O.B.:  [birth year] (18 yrs)(F)  Name:       Tina Castaneda               Visit Date: 08/23/2023 02:17 pm              Tina Castaneda ---------------------------------------------------------------------- Performed By  Attending:        [name] DO       Ref. Address:     [address]                                                             [place], [place]  [address]  Performed By:     Comer Harrow       [facility]  Referred By:      [facility] ---------------------------------------------------------------------- Orders  #  Description                           Code        Ordered By  1  US  MFM OB FOLLOW UP                   23183.98    FREDIA FRESH ----------------------------------------------------------------------  #  Order #                     Accession #                Episode #  1  [id number]                   [id number]                 [id number] ---------------------------------------------------------------------- Indications  Anemia during pregnancy in third trimester     O99.013  Other mental disorder complicating             O99.340  pregnancy, third trimester  Encounter for other antenatal screening        Z36.2  follow-up  [redacted] weeks gestation of pregnancy                Z3A.28  LR NIPS/ Neg Horizon/ Neg AFP  Pregravid BMI 16.6 ---------------------------------------------------------------------- Vital Signs  BP:          97/61 ---------------------------------------------------------------------- Fetal Evaluation  Num Of Fetuses:         1  Fetal Heart Rate(bpm):  161  Cardiac Activity:       Observed  Presentation:           Cephalic  Placenta:               Posterior  P. Cord Insertion:      Previously seen  Amniotic Fluid  AFI FV:      Within normal limits  AFI Sum(cm)     %Tile       Largest Pocket(cm)  19.95           79  6.58  RUQ(cm)       RLQ(cm)       LUQ(cm)        LLQ(cm)  6.58          5.69          3.18           4.5 ---------------------------------------------------------------------- Biometry  BPD:      66.7  mm     G. Age:  26w 6d          8  %     CI:        73.19   %    70 - 86                                                          FL/HC:      20.8   %    18.8 - 20.6  HC:      247.8  mm     G. Age:  26w 6d        2.5  %    HC/AC:      1.02        1.05 - 1.21  AC:      243.4  mm     G. Age:  28w 4d         57  %    FL/BPD:     77.4   %    71 - 87  FL:       51.6  mm     G. Age:  27w 4d         20  %    FL/AC:      21.2   %    20 - 24  CER:        34  mm     G. Age:  28w 6d         65  %  Est. FW:    1154  gm      2 lb 9 oz     31  % ---------------------------------------------------------------------- OB History  Gravidity:    1         Term:   0        Prem:   0        SAB:   0  TOP:          0       Ectopic:  0        Living: 0 ---------------------------------------------------------------------- Gestational Age  LMP:           28w 1d        Date:  02/07/23                  EDD:   11/14/23  U/S Today:     27w 3d                                        EDD:   11/19/23  Best:          28w 1d     Det. By:  LMP  (02/07/23)          EDD:   11/14/23 ----------------------------------------------------------------------  Targeted Anatomy  Central Nervous System  Calvarium/Cranial V.:  Appears normal         Cereb./Vermis:          Appears normal  Cavum:                 Appears normal         Cisterna Magna:         Appears normal  Lateral Ventricles:    Appears normal         Midline Falx:           Appears normal  Choroid Plexus:        Previously seen  Spine  Cervical:              Previously seen        Sacral:                 Previously seen  Thoracic:              Previously seen        Shape/Curvature:        Previously seen  Lumbar:                Previously seen  Head/Neck  Lips:                  Appears normal         Profile:                Appears normal  Neck:                  Previously seen        Orbits/Eyes:            Previously seen  Nuchal Fold:           Previously seen        Mandible:               Previously seen  Nasal Bone:             Present                Maxilla:                Previously seen  Thorax  4 Chamber View:        Previously seen        Interventr. Septum:     Previously seen  Cardiac Rhythm:        Normal                 Cardiac Axis:           Normal  Cardiac Situs:         Previously seen        Diaphragm:              Appears normal  Rt Outflow Tract:      Previously seen        3 Vessel View:          Previously seen  Lt Outflow Tract:      Previously seen        3 V Trachea View:       Previously seen  Aortic Arch:           Previously seen        IVC:  Previously seen  Ductal Arch:           Previously seen        Crossing:               Previously seen  SVC:                   Previously seen  Abdomen  Ventral Wall:          Previously seen        Lt Kidney:              Appears normal  Cord Insertion:        Previously seen        Rt Kidney:              Appears normal  Situs:                 Previously seen        Bladder:                Appears normal  Stomach:               Appears normal  Extremities  Lt Humerus:            Previously seen        Lt Femur:               Previously seen  Rt Humerus:            Previously seen        Rt Femur:               Previously seen  Lt Forearm:            Previously seen        Lt Lower Leg:           Previously seen  Rt Forearm:            Previously seen        Rt Lower Leg:           Previously seen  Lt Hand:               Previously seen        Lt Foot:                Previously seen  Rt Hand:               Previously seen        Rt Foot:                Previously seen  Other  Umbilical Cord:        Previously seen        Genitalia:              Female prev seen  Comment:     Fetal anatomic survey complete. ---------------------------------------------------------------------- Comments  Maternal Fetal medicine Consult  Tina Castaneda [facility] is a 18 y.o.  G2P0010 at [redacted]w[redacted]d here for ultrasound and consultation.  Elijah KEISHUNA [facility] is doing  well  today with no acute concerns. Today we focused on the  following:  Low BMI:  The patient is doing well today and has gained  about 17 lbs this pregnancy. The fetal weight is stable.  Unless other indications arise there is no need for future  ultrasounds at this time.  The patient had time to ask questions that were answered to  her satisfaction.  She verbalized understanding and agrees to  proceed with the plan below.  Sonographic findings  Single intrauterine pregnancy at 28w 1d.  Fetal cardiac activity:  Observed and appears normal.  Presentation: Cephalic.  Interval fetal anatomy appears normal.  Fetal biometry shows the estimated fetal weight at the 31  percentile.  Amniotic fluid volume: Within normal limits. MVP: 6.58 cm.  Placenta: Posterior.  There are limitations of prenatal ultrasound such as the  inability to detect certain abnormalities due to poor  visualization. Various factors such as fetal position,  gestational age and maternal body habitus may increase the  difficulty in visualizing the fetal anatomy.  Recommendations  -No further ultrasounds are recommended at this time based  on the current indications. If future indications arise (e.g.  size/date discrepancy on fundal height, gestational diabetes  or hypertension) and an ultrasound is to be desired at our  MFM office, please send a referral. ----------------------------------------------------------------------                  [name], DO Electronically Signed Final Report   08/23/2023 03:22 pm ----------------------------------------------------------------------    MAU Management/MDM: Orders Placed This Encounter  Procedures   Wet prep, genital   Discharge patient    Meds ordered this encounter  Medications   acetaminophen  (TYLENOL ) 500 MG tablet    Sig: Take 2 tablets (1,000 mg total) by mouth every 6 (six) hours as needed.    Dispense:  120 tablet    Refill:  1   cyclobenzaprine  (FLEXERIL ) 10 MG tablet    Sig:  Take 1 tablet (10 mg total) by mouth 3 (three) times daily as needed for up to 10 days for muscle spasms.    Dispense:  30 tablet    Refill:  0     Available prenatal records reviewed.  - pelvic pressure concerning for PTL given history of threatened PTL; cervical exam rules out PTL - ordered wet prep and GC, will send patient my chart message if results need treatment - DFM now resolved with reactive NST - Numbness tingling consistent with neuropathic pain without red flag symptoms; trial prescription for Flexeril  10 mg TID PRN and Tylenol  1000 mg QID PRN for pain relief - Follow up with routine OB provider  ASSESSMENT 1. Pelvic pressure in pregnancy   2. Encounter for supervision of normal pregnancy in teen primigravida, antepartum   3. [redacted] weeks gestation of pregnancy   4. NST (non-stress test) reactive   5. Neuropathic pain     PLAN - Follow up with routine OB provider - Discharge home with strict return precautions. Allergies as of 09/07/2023   No Known Allergies      Medication List     STOP taking these medications    clotrimazole  1 % vaginal cream Commonly known as: GYNE-LOTRIMIN    ferrous sulfate  325 (65 FE) MG tablet   fluconazole  150 MG tablet Commonly known as: Diflucan    terconazole  0.8 % vaginal cream Commonly known as: TERAZOL 3        TAKE these medications    acetaminophen  500 MG tablet Commonly known as: TYLENOL  Take 2 tablets (1,000 mg total) by mouth every 6 (six) hours as needed.   cyclobenzaprine  10 MG tablet Commonly known as: FLEXERIL  Take 1 tablet (10 mg total) by mouth 3 (three) times daily as needed for up to 10 days for muscle spasms.   PrePLUS 27-1 MG Tabs Take 1 tablet by mouth daily.  Follow-up Information     [facility] for [facility] Follow up.   Specialty: Obstetrics and Gynecology Why: for routinely scheduled OB appointment Contact information: [address] [place]  [address] [phone]                Elio Alena Morrison, MD Family Medicine PGY1 09/07/2023  6:22 PM   Attestation of Supervision of Student:  I confirm that I have verified the information documented in the Resident Physician's  note and that I have also personally reperformed the history, physical exam and all medical decision making activities.  I have verified that all services and findings are accurately documented in this student's note; and I agree with management and plan as outlined in the documentation. I have also made any necessary editorial changes.  I was personally present during the encounter and verified the HPI, PE and  performed the Cervical exam, I also interpreted the NST   Olam DELENA Dalton, NP [facility] [employer] 09/07/2023 8:12 PM

## 2023-09-07 NOTE — MAU Note (Signed)
 Tina Castaneda is a 18 y.o. at [redacted]w[redacted]d here in MAU reporting: she's having ongoing pelvic pressure but today the pressure is causing tingling and numbness in bilateral legs.  Also reports began seeing spots in both eyes today.   States having decreased FM, but has felt movent since arrival to hospital.  Denies VB or LOF.  LMP: 02/07/2023 Onset of complaint: today Pain score: 7 Vitals:   09/07/23 1649  BP: 107/65  Pulse: (!) 112  Resp: 19  Temp: 98.6 F (37 C)  SpO2: 99%     FHT: 140 bpm  Lab orders placed from triage: UA

## 2023-09-08 LAB — GC/CHLAMYDIA PROBE AMP (~~LOC~~) NOT AT ARMC
Chlamydia: NEGATIVE
Comment: NEGATIVE
Comment: NORMAL
Neisseria Gonorrhea: NEGATIVE

## 2023-09-15 ENCOUNTER — Encounter: Payer: Self-pay | Admitting: Obstetrics and Gynecology

## 2023-09-15 ENCOUNTER — Ambulatory Visit: Admitting: Obstetrics and Gynecology

## 2023-09-15 VITALS — BP 105/63 | HR 91 | Wt 121.6 lb

## 2023-09-15 DIAGNOSIS — Z3400 Encounter for supervision of normal first pregnancy, unspecified trimester: Secondary | ICD-10-CM | POA: Diagnosis not present

## 2023-09-15 DIAGNOSIS — Z3A31 31 weeks gestation of pregnancy: Secondary | ICD-10-CM

## 2023-09-15 NOTE — Progress Notes (Signed)
   PRENATAL VISIT NOTE  Subjective:  Tina Castaneda is a 18 y.o. G2P0010 at [redacted]w[redacted]d being seen today for ongoing prenatal care.  She is currently monitored for the following issues for this low-risk pregnancy and has At risk for tuberculosis; Suicide ideation; Severe recurrent major depression without psychotic features (HCC); Tylenol  overdose; Suicide attempt [facility]); MDD (major depressive disorder), severe (HCC); Vomiting complicating pregnancy; Underweight; Encounter for supervision of normal pregnancy in teen primigravida, antepartum; [redacted] weeks gestation of pregnancy; and Vaginal discharge during pregnancy in third trimester on their problem list.  Patient reports seen in mau on .  Contractions: Not present. Vag. Bleeding: None.  Movement: Present. Denies leaking of fluid.   The following portions of the patient's history were reviewed and updated as appropriate: allergies, current medications, past family history, past medical history, past social history, past surgical history and problem list.   Objective:    Vitals:   09/15/23 0908  BP: 105/63  Pulse: 91  Weight: 121 lb 9.6 oz (55.2 kg)    Fetal Status:  Fetal Heart Rate (bpm): 145 Fundal Height: 31 cm Movement: Present    General: Alert, oriented and cooperative. Patient is in no acute distress.  Skin: Skin is warm and dry. No rash noted.   Cardiovascular: Normal heart rate noted  Respiratory: Normal respiratory effort, no problems with respiration noted  Abdomen: Soft, gravid, appropriate for gestational age.  Pain/Pressure: Present     Pelvic: Cervical exam deferred        Extremities: Normal range of motion.  Edema: Trace  Mental Status: Normal mood and affect. Normal behavior. Normal judgment and thought content.   Assessment and Plan:  Pregnancy: G2P0010 at [redacted]w[redacted]d 1. Encounter for supervision of normal pregnancy in teen primigravida, antepartum (Primary) BP and FHR normal Doing well, feeling regular movement   Fh appropriate  2. [redacted] weeks gestation of pregnancy Supportive measures discussed regarding pressure and pain, discussed stretches, precautions when to follow up   Preterm labor symptoms and general obstetric precautions including but not limited to vaginal bleeding, contractions, leaking of fluid and fetal movement were reviewed in detail with the patient. Please refer to After Visit Summary for other counseling recommendations.   Return in about 2 weeks (around 09/29/2023) for OB VISIT (MD or APP).   Nidia Daring, FNP

## 2023-09-15 NOTE — Progress Notes (Signed)
 Pt presents for ROB visit. Pt c/o vaginal pressure and rib pain.

## 2023-09-22 ENCOUNTER — Encounter (HOSPITAL_COMMUNITY): Payer: Self-pay | Admitting: Obstetrics & Gynecology

## 2023-09-22 ENCOUNTER — Inpatient Hospital Stay (HOSPITAL_COMMUNITY)
Admission: AD | Admit: 2023-09-22 | Discharge: 2023-09-22 | Disposition: A | Discharge status: Home / Self Care | Attending: Obstetrics & Gynecology | Admitting: Obstetrics & Gynecology

## 2023-09-22 DIAGNOSIS — B3731 Acute candidiasis of vulva and vagina: Secondary | ICD-10-CM

## 2023-09-22 DIAGNOSIS — O26893 Other specified pregnancy related conditions, third trimester: Secondary | ICD-10-CM

## 2023-09-22 DIAGNOSIS — Z9151 Personal history of suicidal behavior: Secondary | ICD-10-CM | POA: Insufficient documentation

## 2023-09-22 DIAGNOSIS — O99343 Other mental disorders complicating pregnancy, third trimester: Secondary | ICD-10-CM | POA: Diagnosis not present

## 2023-09-22 DIAGNOSIS — F333 Major depressive disorder, recurrent, severe with psychotic symptoms: Secondary | ICD-10-CM | POA: Diagnosis not present

## 2023-09-22 DIAGNOSIS — O212 Late vomiting of pregnancy: Secondary | ICD-10-CM | POA: Diagnosis not present

## 2023-09-22 DIAGNOSIS — R109 Unspecified abdominal pain: Secondary | ICD-10-CM | POA: Diagnosis not present

## 2023-09-22 DIAGNOSIS — Z3A32 32 weeks gestation of pregnancy: Secondary | ICD-10-CM

## 2023-09-22 DIAGNOSIS — O98813 Other maternal infectious and parasitic diseases complicating pregnancy, third trimester: Secondary | ICD-10-CM | POA: Insufficient documentation

## 2023-09-22 DIAGNOSIS — B9689 Other specified bacterial agents as the cause of diseases classified elsewhere: Secondary | ICD-10-CM

## 2023-09-22 DIAGNOSIS — O23593 Infection of other part of genital tract in pregnancy, third trimester: Secondary | ICD-10-CM | POA: Diagnosis not present

## 2023-09-22 DIAGNOSIS — N760 Acute vaginitis: Secondary | ICD-10-CM | POA: Diagnosis not present

## 2023-09-22 DIAGNOSIS — O26899 Other specified pregnancy related conditions, unspecified trimester: Secondary | ICD-10-CM

## 2023-09-22 LAB — URINALYSIS, ROUTINE W REFLEX MICROSCOPIC
Bilirubin Urine: NEGATIVE
Glucose, UA: NEGATIVE mg/dL
Hgb urine dipstick: NEGATIVE
Ketones, ur: NEGATIVE mg/dL
Leukocytes,Ua: NEGATIVE
Nitrite: NEGATIVE
Protein, ur: NEGATIVE mg/dL
Specific Gravity, Urine: 1.006 (ref 1.005–1.030)
pH: 7 (ref 5.0–8.0)

## 2023-09-22 LAB — WET PREP, GENITAL
Sperm: NONE SEEN
Trich, Wet Prep: NONE SEEN
WBC, Wet Prep HPF POC: >=10 — AB (ref <10)

## 2023-09-22 MED ORDER — CYCLOBENZAPRINE HCL 5 MG PO TABS
10.0000 mg | ORAL_TABLET | Freq: Once | ORAL | Status: AC
  Administered 2023-09-22: 10 mg via ORAL
  Filled 2023-09-22: qty 2

## 2023-09-22 MED ORDER — FLUCONAZOLE 150 MG PO TABS
150.0000 mg | ORAL_TABLET | Freq: Every day | ORAL | 1 refills | Status: DC
Start: 2023-09-22 — End: 2023-10-05

## 2023-09-22 MED ORDER — METRONIDAZOLE 500 MG PO TABS: 500.0000 mg | ORAL_TABLET | Freq: Two times a day (BID) | ORAL | 0 refills | Status: DC

## 2023-09-22 MED ORDER — ACETAMINOPHEN 500 MG PO TABS
1000.0000 mg | ORAL_TABLET | Freq: Once | ORAL | Status: AC
  Administered 2023-09-22: 1000 mg via ORAL
  Filled 2023-09-22: qty 2

## 2023-09-22 MED ORDER — FLUCONAZOLE 150 MG PO TABS
150.0000 mg | ORAL_TABLET | Freq: Every day | ORAL | 1 refills | Status: DC
Start: 2023-09-22 — End: 2023-09-22

## 2023-09-22 NOTE — MAU Note (Signed)
 Tina Castaneda is a 18 y.o. at [redacted]w[redacted]d here in MAU reporting: lower abd pain/cramping that started at 1735. Pains coming every few minutes feel like she has to have a BM. Denies any vag bleeding or discharge. Good fetal movement felt.   LMP:  Onset of complaint: 1735 Pain score: 7-8 Vitals:   09/22/23 1929  BP: 120/60  Pulse: (!) 109  Resp: 18  Temp: 98.4 F (36.9 C)     FHT: 145   Lab orders placed from triage: u/a

## 2023-09-22 NOTE — MAU Provider Note (Signed)
 Chief Complaint:  Abdominal Pain   HPI   Event Date/Time   First Provider Initiated Contact with Patient 09/22/23 1950      Tina Castaneda is a 18 y.o. G2P0010 at [redacted]w[redacted]d who presents to maternity admissions reporting that at 1735 while at work she started having abdominal pain/cramping.  She reports that the abdominal cramping has been coming and going over the last hour but denies any vaginal bleeding leaking of fluid, urinary discomfort , vaginal irritation or discharge.  Patient has also been seen in the MAU before for similar complaints and neuropathic pain.  She reports good fetal movements  Patient's pregnancy course is complicated by severe recurrent major depression with psychotic features, Tylenol  overdose, suicide attempt, MDD,  and vomiting.  She is seen at [facility] & available prenatal records reviewed  Pregnancy Course: [facility]   Past Medical History:  Diagnosis Date   Anemia    Anxiety    Asthma    Depression    OB History  Gravida Para Term Preterm AB Living  2 0 0 0 1 0  SAB IAB Ectopic Multiple Live Births  0 1 0 0 0    # Outcome Date GA Lbr Len/2nd Weight Sex Type Anes PTL Lv  2 Current           1 IAB            Past Surgical History:  Procedure Laterality Date   WISDOM TOOTH EXTRACTION     Family History  Problem Relation Age of Onset   Healthy Mother    Healthy Father    Asthma Sister    Asthma Maternal Grandmother    Social History   Tobacco Use   Smoking status: Never    Passive exposure: Never   Smokeless tobacco: Never  Vaping Use   Vaping status: Never Used  Substance Use Topics   Alcohol use: Not Currently   Drug use: Not Currently    Types: Marijuana   No Known Allergies Medications Prior to Admission  Medication Sig Dispense Refill Last Dose/Taking   Prenatal Vit-Fe Fumarate-FA (PREPLUS) 27-1 MG TABS Take 1 tablet by mouth daily. 30 tablet 13 09/22/2023   acetaminophen  (TYLENOL ) 500 MG tablet Take 2 tablets (1,000 mg  total) by mouth every 6 (six) hours as needed. (Patient not taking: Reported on 09/15/2023) 120 tablet 1     I have reviewed patient's Past Medical Hx, Surgical Hx, Family Hx, Social Hx, medications and allergies.   ROS  Pertinent items noted in HPI and remainder of comprehensive ROS otherwise negative.   PHYSICAL EXAM  Patient Vitals for the past 24 hrs:  BP Temp Temp src Pulse Resp Height Weight  09/22/23 1929 120/60 98.4 F (36.9 C) Oral (!) 109 18 5' 3 (1.6 m) 55.8 kg    Constitutional: Well-developed, female in no acute distress but appears to have flat affect  Cardiovascular: normal rate & rhythm, warm and well-perfused Respiratory: normal effort, no problems with respiration noted GI: Abd soft, non-tender, gravid, no reproducible pain on palpation MS: Extremities nontender, no edema, normal ROM Neurologic: Oriented x 4  Pelvic:  Chaperoned by Morna Piano RN   Speculum exam: No pooling, no evidence of blood, cervix is friable but visually closed. Vaginal swabs obtained  Fetal Tracing: Baseline: 140 Variability: moderate Accelerations: present Decelerations: absent Toco: irregular vs irritability    Labs: Results for orders placed or performed during the hospital encounter of 09/22/23 (from the past 24 hours)  Urinalysis,  Routine w reflex microscopic -Urine, Clean Catch     Status: Abnormal   Collection Time: 09/22/23  7:32 PM  Result Value Ref Range   Color, Urine STRAW (A) YELLOW   APPearance CLEAR CLEAR   Specific Gravity, Urine 1.006 1.005 - 1.030   pH 7.0 5.0 - 8.0   Glucose, UA NEGATIVE NEGATIVE mg/dL   Hgb urine dipstick NEGATIVE NEGATIVE   Bilirubin Urine NEGATIVE NEGATIVE   Ketones, ur NEGATIVE NEGATIVE mg/dL   Protein, ur NEGATIVE NEGATIVE mg/dL   Nitrite NEGATIVE NEGATIVE   Leukocytes,Ua NEGATIVE NEGATIVE  Wet prep, genital     Status: Abnormal   Collection Time: 09/22/23  8:10 PM  Result Value Ref Range   Yeast Wet Prep HPF POC PRESENT (A) NONE  SEEN   Trich, Wet Prep NONE SEEN NONE SEEN   Clue Cells Wet Prep HPF POC PRESENT (A) NONE SEEN   WBC, Wet Prep HPF POC >=10 (A) <10   Sperm NONE SEEN     Imaging:  No results found.  MDM & MAU COURSE  MDM:  HIGH  Prenatal chart reviewed Physical exam performed with speculum exam Swab vaginal swabs obtained ( C/W Yeast and BV - Plan for RX at discharge) NST in progress for gestational age and fetal reassurance Toco: To monitor for contractions UA : Neg Tylenol  Flexeril  ordered for discomfort Will reassess patient after medication and labs result  After review of labs : abdominal cramping  likely d/t Vaginal infection - Plan for Discharge with RX's and return precautions    MAU Course: Orders Placed This Encounter  Procedures   Wet prep, genital   Urinalysis, Routine w reflex microscopic -Urine, Clean Catch   Discharge patient Discharge disposition: 01-Home or Self Care; Discharge patient date: 09/22/2023   Meds ordered this encounter  Medications   acetaminophen  (TYLENOL ) tablet 1,000 mg   cyclobenzaprine  (FLEXERIL ) tablet 10 mg   metroNIDAZOLE  (FLAGYL ) 500 MG tablet    Sig: Take 1 tablet (500 mg total) by mouth 2 (two) times daily.    Dispense:  14 tablet    Refill:  0    Supervising Provider:   PRATT, TANYA S [[id number]]   fluconazole  (DIFLUCAN ) 150 MG tablet    Sig: Take 1 tablet (150 mg total) by mouth daily.    Dispense:  1 tablet    Refill:  1    Supervising Provider:   PRATT, TANYA S [[id number]]    I have reviewed the patient chart and performed the physical exam . I have ordered & interpreted the lab results and reviewed and interpreted the NST Medications ordered as stated below.  A/P as described below.  Counseling and education provided and patient agreeable  with plan as described below. Verbalized understanding.    ASSESSMENT   1. Yeast vaginitis   2. Bacterial vaginosis   3. Abdominal cramping affecting pregnancy   4. [redacted] weeks gestation of pregnancy       PLAN  Discharge home in stable condition with return precautions.   See AVS for full description of information given to the patient including both verbal and written. Patient verbalized understanding and agrees with the plan as described above.     Follow-up Information     [facility] Follow up.   Specialty: Obstetrics and Gynecology Why: If symptoms worsen or fail to resolve, As scheduled for ongoing prenatal care Contact information: [address] [place]  [phone]  Allergies as of 09/22/2023   No Known Allergies      Medication List     TAKE these medications    acetaminophen  500 MG tablet Commonly known as: TYLENOL  Take 2 tablets (1,000 mg total) by mouth every 6 (six) hours as needed.   fluconazole  150 MG tablet Commonly known as: Diflucan  Take 1 tablet (150 mg total) by mouth daily.   metroNIDAZOLE  500 MG tablet Commonly known as: FLAGYL  Take 1 tablet (500 mg total) by mouth 2 (two) times daily.   PrePLUS 27-1 MG Tabs Take 1 tablet by mouth daily.        Olam Dalton, MSN, [facility], Center for [employer]

## 2023-09-23 LAB — GC/CHLAMYDIA PROBE AMP (~~LOC~~) NOT AT ARMC
Chlamydia: NEGATIVE
Comment: NEGATIVE
Comment: NORMAL
Neisseria Gonorrhea: NEGATIVE

## 2023-09-29 ENCOUNTER — Encounter: Payer: Self-pay | Admitting: Physician Assistant

## 2023-09-29 ENCOUNTER — Ambulatory Visit (INDEPENDENT_AMBULATORY_CARE_PROVIDER_SITE_OTHER): Admitting: Physician Assistant

## 2023-09-29 VITALS — BP 109/71 | HR 103 | Wt 125.6 lb

## 2023-09-29 DIAGNOSIS — Z3490 Encounter for supervision of normal pregnancy, unspecified, unspecified trimester: Secondary | ICD-10-CM

## 2023-09-29 DIAGNOSIS — Z3A33 33 weeks gestation of pregnancy: Secondary | ICD-10-CM | POA: Diagnosis not present

## 2023-09-29 NOTE — Progress Notes (Signed)
   PRENATAL VISIT NOTE  Subjective:  Tina Castaneda is a 18 y.o. G2P0010 at [redacted]w[redacted]d being seen today for ongoing prenatal care.  She is currently monitored for the following issues for this high-risk pregnancy and has At risk for tuberculosis; Suicide ideation; Severe recurrent major depression without psychotic features (HCC); Tylenol  overdose; Suicide attempt [facility]); MDD (major depressive disorder), severe (HCC); Vomiting complicating pregnancy; Underweight; Encounter for supervision of normal pregnancy in teen primigravida, antepartum; [redacted] weeks gestation of pregnancy; and Vaginal discharge during pregnancy in third trimester on their problem list.  Patient reports no complaints, no cramping since MAU visit  09/22/23.   Contractions: Irritability. Vag. Bleeding: None.  Movement: Present. Denies leaking of fluid.   The following portions of the patient's history were reviewed and updated as appropriate: allergies, current medications, past family history, past medical history, past social history, past surgical history and problem list.   Objective:    Vitals:   09/29/23 0904  BP: 109/71  Pulse: (!) 103  Weight: 125 lb 9.6 oz (57 kg)    Fetal Status:  Fetal Heart Rate (bpm): 141 Fundal Height: 33 cm Movement: Present    General: Alert, oriented and cooperative. Patient is in no acute distress.  Skin: Skin is warm and dry. No rash noted.   Cardiovascular: Normal heart rate noted  Respiratory: Normal respiratory effort, no problems with respiration noted  Abdomen: Soft, gravid, appropriate for gestational age.  Pain/Pressure: Present     Pelvic: Cervical exam deferred        Extremities: Normal range of motion.  Edema: Trace  Mental Status: Normal mood and affect. Normal behavior. Normal judgment and thought content.   Assessment and Plan:  Pregnancy: G2P0010 at [redacted]w[redacted]d  1. Encounter for supervision of low-risk pregnancy, antepartum (Primary) Patient doing well, feeling  regular fetal movement  BP, FHR, FH appropriate   2. [redacted] weeks gestation of pregnancy Anticipatory guidance about next visits/weeks of pregnancy given.   Preterm labor symptoms and general obstetric precautions including but not limited to vaginal bleeding, contractions, leaking of fluid and fetal movement were reviewed in detail with the patient.  Please refer to After Visit Summary for other counseling recommendations.   Return in about 2 weeks (around 10/13/2023).  No future appointments.  Lulubelle Simcoe E Curran Lenderman, PA-C

## 2023-09-29 NOTE — Progress Notes (Signed)
 Pt presents for ROB visit. No concerns

## 2023-10-05 ENCOUNTER — Inpatient Hospital Stay (HOSPITAL_COMMUNITY)
Admission: AD | Admit: 2023-10-05 | Discharge: 2023-10-05 | Disposition: A | Discharge status: Home / Self Care | Payer: Self-pay | Attending: Obstetrics and Gynecology | Admitting: Obstetrics and Gynecology

## 2023-10-05 ENCOUNTER — Encounter (HOSPITAL_COMMUNITY): Payer: Self-pay | Admitting: Obstetrics and Gynecology

## 2023-10-05 ENCOUNTER — Inpatient Hospital Stay (HOSPITAL_BASED_OUTPATIENT_CLINIC_OR_DEPARTMENT_OTHER)

## 2023-10-05 DIAGNOSIS — O368130 Decreased fetal movements, third trimester, not applicable or unspecified: Secondary | ICD-10-CM | POA: Diagnosis present

## 2023-10-05 DIAGNOSIS — Z3A34 34 weeks gestation of pregnancy: Secondary | ICD-10-CM | POA: Diagnosis not present

## 2023-10-05 DIAGNOSIS — O289 Unspecified abnormal findings on antenatal screening of mother: Secondary | ICD-10-CM | POA: Diagnosis not present

## 2023-10-05 DIAGNOSIS — O26893 Other specified pregnancy related conditions, third trimester: Secondary | ICD-10-CM | POA: Insufficient documentation

## 2023-10-05 DIAGNOSIS — R81 Glycosuria: Secondary | ICD-10-CM | POA: Diagnosis not present

## 2023-10-05 DIAGNOSIS — O368330 Maternal care for abnormalities of the fetal heart rate or rhythm, third trimester, not applicable or unspecified: Secondary | ICD-10-CM | POA: Diagnosis not present

## 2023-10-05 DIAGNOSIS — L299 Pruritus, unspecified: Secondary | ICD-10-CM | POA: Diagnosis not present

## 2023-10-05 LAB — URINALYSIS, ROUTINE W REFLEX MICROSCOPIC
Bilirubin Urine: NEGATIVE
Glucose, UA: >=500 mg/dL — AB
Hgb urine dipstick: NEGATIVE
Ketones, ur: NEGATIVE mg/dL
Leukocytes,Ua: NEGATIVE
Nitrite: NEGATIVE
Protein, ur: 30 mg/dL — AB
Specific Gravity, Urine: 1.02 (ref 1.005–1.030)
pH: 8.5 — ABNORMAL HIGH (ref 5.0–8.0)

## 2023-10-05 LAB — COMPREHENSIVE METABOLIC PANEL WITH GFR
ALT: 9 U/L (ref 0–44)
AST: 17 U/L (ref 15–41)
Albumin: 2.6 g/dL — ABNORMAL LOW (ref 3.5–5.0)
Alkaline Phosphatase: 78 U/L (ref 38–126)
Anion gap: 8 (ref 5–15)
BUN: 5 mg/dL — ABNORMAL LOW (ref 6–20)
CO2: 23 mmol/L (ref 22–32)
Calcium: 8.6 mg/dL — ABNORMAL LOW (ref 8.9–10.3)
Chloride: 104 mmol/L (ref 98–111)
Creatinine, Ser: 0.59 mg/dL (ref 0.44–1.00)
GFR, Estimated: >60 mL/min (ref >60)
Glucose, Bld: 100 mg/dL — ABNORMAL HIGH (ref 70–99)
Potassium: 3.7 mmol/L (ref 3.5–5.1)
Sodium: 135 mmol/L (ref 135–145)
Total Bilirubin: 0.5 mg/dL (ref 0.0–1.2)
Total Protein: 6.5 g/dL (ref 6.5–8.1)

## 2023-10-05 LAB — URINALYSIS, MICROSCOPIC (REFLEX)

## 2023-10-05 NOTE — Discharge Instructions (Addendum)
 Please follow up with your OB about the glucose in your urine. Bile acid lab is pending at the time of discharge. We will update you once the results are back either by MyChart or by calling you.  Call your OB Clinic or go to [facility] if: You begin to have strong, frequent contractions Your water breaks.  Sometimes it is a big gush of fluid, sometimes it is just a trickle that keeps getting your panties wet or running down your legs You have vaginal bleeding.  It is normal to have a small amount of spotting if your cervix was checked.  You don't feel your baby moving like normal.  If you don't, get you something to eat and drink and lay down and focus on feeling your baby move.  You should feel at least 10 movements in 2 hours.  If you don't, you should call the office or go to [facility].

## 2023-10-05 NOTE — MAU Provider Note (Addendum)
 History     CSN: [id number]  Arrival date and time: 10/05/23 1610   Event Date/Time   First Provider Initiated Contact with Patient 10/05/2023  4:46 PM   Chief Complaint  Patient presents with   Decreased Fetal Movement    HPI  Tina Castaneda is a 18 y.o. G2P0010 at [redacted]w[redacted]d who presents to the MAU for a change in fetal movements and lower abdominal and pelvic pressure. Patient reports fetal movements have increased and are stronger than normal but feel normal at the time of exam. Pelvic pressure is worst at night. Patient also reports itching on the soles of her feet and sometimes palms only at night x1 month. Of note, patient finished a 5 day course of Flagyl  on Friday and took Diflucan  today for BV and yeast infection. Patient denies vaginal bleeding, vaginal itching, leaking, abdominal pain, strenuous physical activity or sexual intercourse. She drinks ~100 oz water per day.   Past Medical History:  Diagnosis Date   Anemia    Anxiety    Asthma    Depression     Past Surgical History:  Procedure Laterality Date   WISDOM TOOTH EXTRACTION      Family History  Problem Relation Age of Onset   Healthy Mother    Healthy Father    Asthma Sister    Asthma Maternal Grandmother     Social History   Tobacco Use   Smoking status: Never    Passive exposure: Never   Smokeless tobacco: Never  Vaping Use   Vaping status: Never Used  Substance Use Topics   Alcohol use: Not Currently   Drug use: Not Currently    Types: Marijuana    Allergies: No Known Allergies  Medications Prior to Admission  Medication Sig Dispense Refill Last Dose/Taking   fluconazole  (DIFLUCAN ) 150 MG tablet Take 1 tablet (150 mg total) by mouth daily. 1 tablet 1 10/05/2023   Prenatal Vit-Fe Fumarate-FA (PREPLUS) 27-1 MG TABS Take 1 tablet by mouth daily. 30 tablet 13 10/05/2023   acetaminophen  (TYLENOL ) 500 MG tablet Take 2 tablets (1,000 mg total) by mouth every 6 (six) hours as needed.  (Patient not taking: Reported on 09/29/2023) 120 tablet 1    metroNIDAZOLE  (FLAGYL ) 500 MG tablet Take 1 tablet (500 mg total) by mouth 2 (two) times daily. (Patient not taking: No sig reported) 14 tablet 0 Not Taking    ROS reviewed and pertinent positives and negatives as documented in HPI.  Physical Exam   Blood pressure 115/63, pulse (!) 123, temperature 98.8 F (37.1 C), resp. rate 18, height 5' 3 (1.6 m), weight 58.5 kg, last menstrual period 02/07/2023, SpO2 98%.  Physical Exam  General: Alert, well-appearing female, in NAD HEENT: Normocephalic, atraumatic, EOM grossly intact Respiratory: Breathing comfortably on room air Abdomen: Soft, non-tender, gravid, appropriate for gestational age Extremities: Moves all four extremities appropriately. No peripheral edema. Neuro: Alert, no obvious focal deficits Skin: No lesions/rashes visualized Psych: Normal affect and mood  NST: baseline 145, accelerations present, moderate variability, few decelerations, rare contractions   MAU Course  Procedures  MDM 18 y.o. G2P0010 at [redacted]w[redacted]d presenting for increased fetal movements that are stronger in nature and lower abdominal pressure. On exam, patient is comfortable on room air with NST showing rare contractions. This is likely Braxton-Hicks contractions or related to fetal positioning into pelvis but will continue to monitor. Itching of soles of the feet and sometimes palms is consistent with intrahepatic cholestasis of pregnancy so ordered CMP for  LFTs and bile acid.  1755: Pt with bowel movement, cervix closed. 1653: CMP with normal AST/ALT. UA with glucose >500 but glucose . Bile acids will take a few days to result - will update patient. 1908: A couple decels noted on NST. Offered patient juice and snack and ordered to further evaluate. 1750: BPP 10/10. Educated patient on need for repeat cervical check but patient refused repeat exam which was recommended to rule out dilation.   2010 FHT  baseline 145bpm. Mod variability. +accelerations, no decelerations. Cat I Rare contractions and uterine irritability noted  Patient likely experiencing pelvic pressure due to third trimester of pregnancy and fetal descent into pelvis. Likely occasional decelerations noted on monitoring benign and due to late preterm gestational age given reassuring BPP and overall Cat I appearance with moderate variability and reactivity of FHT. Discussed following up with OB-Gyn regarding glucosuria. Blood glucose normal.  Assessment and Plan     ICD-10-CM   1. Decreased fetal movements in third trimester, single or unspecified fetus  O36.8130     2. [redacted] weeks gestation of pregnancy  Z3A.34     3. Itching  L29.9     4. Glucosuria  R81      - Discharge from MAU in stable condition with strict return precautions - BPP 10/10 - Follow up with OB as scheduled for ongoing prenatal care - Follow up glucosuria with OB-Gyn - Will follow up with patients once bile acid labs return  Darren Jernigan, DO 10/05/2023, 5:30 PM   GME ATTESTATION:  Evaluation and management procedures were performed by the [facility] Medicine Resident under my supervision. I was immediately available for direct supervision, assistance and direction throughout this encounter.  I also confirm that I have verified the information documented in the resident's note, and that I have also personally reperformed the pertinent components of the physical exam and all of the medical decision making activities.  I have also made any necessary editorial changes.  Leeroy KATHEE Pouch, MD OB Fellow, Faculty Practice [facility], Center for [facility] 10/05/2023 9:17 PM

## 2023-10-05 NOTE — MAU Note (Signed)
 Tina Castaneda is a 18 y.o. at [redacted]w[redacted]d here in MAU reporting: decreased fetal movement today. But stated she felt baby move more since she has been here. Also c/o constant pelvic pressure that has been going on for a few days. Denies any vag bleeding or discharge.  Has ben treated for BV and yeast finished meds on Friday.  LMP:  Onset of complaint: today Pain score: 7 Vitals:   10/05/23 1649  BP: 123/61  Pulse: (!) 115  Resp: 18  Temp: 98.8 F (37.1 C)     FHT: 155  Lab orders placed from triage: u/a

## 2023-10-06 ENCOUNTER — Encounter: Payer: Self-pay | Admitting: Obstetrics and Gynecology

## 2023-10-06 ENCOUNTER — Other Ambulatory Visit: Payer: Self-pay

## 2023-10-06 DIAGNOSIS — O26649 Intrahepatic cholestasis of pregnancy, unspecified trimester: Secondary | ICD-10-CM | POA: Insufficient documentation

## 2023-10-06 LAB — BILE ACIDS, TOTAL: Bile Acids Total: 12 umol/L — ABNORMAL HIGH (ref 0.0–10.0)

## 2023-10-06 MED ORDER — URSODIOL 500 MG PO TABS
500.0000 mg | ORAL_TABLET | Freq: Two times a day (BID) | ORAL | 3 refills | Status: DC
Start: 2023-10-06 — End: 2023-10-27

## 2023-10-11 ENCOUNTER — Encounter: Payer: Self-pay | Admitting: Obstetrics

## 2023-10-11 ENCOUNTER — Ambulatory Visit: Admitting: Obstetrics

## 2023-10-11 VITALS — BP 119/63 | HR 101 | Wt 128.9 lb

## 2023-10-11 DIAGNOSIS — Z3A35 35 weeks gestation of pregnancy: Secondary | ICD-10-CM

## 2023-10-11 DIAGNOSIS — O26643 Intrahepatic cholestasis of pregnancy, third trimester: Secondary | ICD-10-CM | POA: Diagnosis not present

## 2023-10-11 DIAGNOSIS — F322 Major depressive disorder, single episode, severe without psychotic features: Secondary | ICD-10-CM | POA: Diagnosis not present

## 2023-10-11 DIAGNOSIS — O0990 Supervision of high risk pregnancy, unspecified, unspecified trimester: Secondary | ICD-10-CM

## 2023-10-11 NOTE — Progress Notes (Signed)
 Subjective:  Tina Castaneda is a 18 y.o. G2P0010 at [redacted]w[redacted]d being seen today for ongoing prenatal care.  She is currently monitored for the following issues for this high-risk pregnancy and has At risk for tuberculosis; Suicide ideation; Severe recurrent major depression without psychotic features (HCC); Tylenol  overdose; Suicide attempt [facility]); MDD (major depressive disorder), severe (HCC); Vomiting complicating pregnancy; Underweight; Encounter for supervision of normal pregnancy in teen primigravida, antepartum; [redacted] weeks gestation of pregnancy; Vaginal discharge during pregnancy in third trimester; and Cholestasis during pregnancy on their problem list.  Patient reports generalized itching.   .  .   . Denies leaking of fluid.   The following portions of the patient's history were reviewed and updated as appropriate: allergies, current medications, past family history, past medical history, past social history, past surgical history and problem list. Problem list updated.  Objective:  There were no vitals filed for this visit.  Fetal Status:           General:  Alert, oriented and cooperative. Patient is in no acute distress.  Skin: Skin is warm and dry. No rash noted.   Cardiovascular: Normal heart rate noted  Respiratory: Normal respiratory effort, no problems with respiration noted  Abdomen: Soft, gravid, appropriate for gestational age.       Pelvic:  Cervical exam deferred        Extremities: Normal range of motion.     Mental Status: Normal mood and affect. Normal behavior. Normal judgment and thought content.   Urinalysis:      Assessment and Plan:  Pregnancy: G2P0010 at [redacted]w[redacted]d  1. Supervision of high risk pregnancy, antepartum (Primary)  2. Cholestasis during pregnancy in third trimester Rx: - Fetal nonstress test:  Reactive.  FHR 140-155, good variability, 15x15 accels, no decels  3. MDD (major depressive disorder), severe (HCC) - clinically stable   4.  Tobacco dependency - stressed the importance of cessation   Preterm labor symptoms and general obstetric precautions including but not limited to vaginal bleeding, contractions, leaking of fluid and fetal movement were reviewed in detail with the patient. Please refer to After Visit Summary for other counseling recommendations.   Return in about 1 week (around 10/18/2023) for [facility].   Rudy Carlin LABOR, MD 10/11/2023

## 2023-10-11 NOTE — Progress Notes (Signed)
 Pt presents for NST. Pt has no questions or concerns at this time.

## 2023-10-12 ENCOUNTER — Other Ambulatory Visit: Payer: Self-pay

## 2023-10-12 ENCOUNTER — Ambulatory Visit (INDEPENDENT_AMBULATORY_CARE_PROVIDER_SITE_OTHER)

## 2023-10-12 DIAGNOSIS — O26643 Intrahepatic cholestasis of pregnancy, third trimester: Secondary | ICD-10-CM

## 2023-10-12 DIAGNOSIS — Z3A35 35 weeks gestation of pregnancy: Secondary | ICD-10-CM | POA: Diagnosis not present

## 2023-10-12 MED ORDER — TERCONAZOLE 0.8 % VA CREA: 1.0000 | TOPICAL_CREAM | Freq: Every day | VAGINAL | 0 refills | Status: AC

## 2023-10-13 ENCOUNTER — Encounter: Admitting: Physician Assistant

## 2023-10-13 ENCOUNTER — Encounter: Payer: Self-pay | Admitting: Family Medicine

## 2023-10-14 ENCOUNTER — Encounter: Payer: Self-pay | Admitting: *Deleted

## 2023-10-18 ENCOUNTER — Other Ambulatory Visit: Payer: Self-pay | Admitting: Obstetrics and Gynecology

## 2023-10-18 MED ORDER — HYDROXYZINE HCL 25 MG PO TABS: 25.0000 mg | ORAL_TABLET | Freq: Four times a day (QID) | ORAL | 2 refills | Status: AC | PRN

## 2023-10-20 ENCOUNTER — Inpatient Hospital Stay (HOSPITAL_COMMUNITY)
Admission: AD | Admit: 2023-10-20 | Discharge: 2023-10-20 | Disposition: A | Discharge status: Home / Self Care | Attending: Obstetrics & Gynecology | Admitting: Obstetrics & Gynecology

## 2023-10-20 ENCOUNTER — Ambulatory Visit (INDEPENDENT_AMBULATORY_CARE_PROVIDER_SITE_OTHER): Admitting: Obstetrics

## 2023-10-20 ENCOUNTER — Encounter (HOSPITAL_COMMUNITY): Payer: Self-pay | Admitting: Obstetrics & Gynecology

## 2023-10-20 ENCOUNTER — Encounter: Admitting: Obstetrics and Gynecology

## 2023-10-20 DIAGNOSIS — R636 Underweight: Secondary | ICD-10-CM

## 2023-10-20 DIAGNOSIS — O99280 Endocrine, nutritional and metabolic diseases complicating pregnancy, unspecified trimester: Secondary | ICD-10-CM

## 2023-10-20 DIAGNOSIS — R55 Syncope and collapse: Secondary | ICD-10-CM

## 2023-10-20 DIAGNOSIS — I9589 Other hypotension: Secondary | ICD-10-CM | POA: Diagnosis present

## 2023-10-20 DIAGNOSIS — F332 Major depressive disorder, recurrent severe without psychotic features: Secondary | ICD-10-CM

## 2023-10-20 DIAGNOSIS — O0990 Supervision of high risk pregnancy, unspecified, unspecified trimester: Secondary | ICD-10-CM

## 2023-10-20 DIAGNOSIS — O26643 Intrahepatic cholestasis of pregnancy, third trimester: Secondary | ICD-10-CM

## 2023-10-20 DIAGNOSIS — E860 Dehydration: Secondary | ICD-10-CM | POA: Diagnosis not present

## 2023-10-20 DIAGNOSIS — Z3A36 36 weeks gestation of pregnancy: Secondary | ICD-10-CM | POA: Insufficient documentation

## 2023-10-20 DIAGNOSIS — O99283 Endocrine, nutritional and metabolic diseases complicating pregnancy, third trimester: Secondary | ICD-10-CM | POA: Insufficient documentation

## 2023-10-20 DIAGNOSIS — O2653 Maternal hypotension syndrome, third trimester: Secondary | ICD-10-CM | POA: Diagnosis not present

## 2023-10-20 DIAGNOSIS — I951 Orthostatic hypotension: Secondary | ICD-10-CM

## 2023-10-20 DIAGNOSIS — R45851 Suicidal ideations: Secondary | ICD-10-CM

## 2023-10-20 LAB — COMPREHENSIVE METABOLIC PANEL WITH GFR
ALT: 7 U/L (ref 0–44)
AST: 15 U/L (ref 15–41)
Albumin: 2.3 g/dL — ABNORMAL LOW (ref 3.5–5.0)
Alkaline Phosphatase: 69 U/L (ref 38–126)
Anion gap: 13 (ref 5–15)
BUN: 7 mg/dL (ref 6–20)
CO2: 20 mmol/L — ABNORMAL LOW (ref 22–32)
Calcium: 9 mg/dL (ref 8.9–10.3)
Chloride: 103 mmol/L (ref 98–111)
Creatinine, Ser: 0.6 mg/dL (ref 0.44–1.00)
GFR, Estimated: >60 mL/min (ref >60)
Glucose, Bld: 99 mg/dL (ref 70–99)
Potassium: 3.9 mmol/L (ref 3.5–5.1)
Sodium: 136 mmol/L (ref 135–145)
Total Bilirubin: 0.2 mg/dL (ref 0.0–1.2)
Total Protein: 5.6 g/dL — ABNORMAL LOW (ref 6.5–8.1)

## 2023-10-20 LAB — CBC
HCT: 26 % — ABNORMAL LOW (ref 36.0–46.0)
Hemoglobin: 7.8 g/dL — ABNORMAL LOW (ref 12.0–15.0)
MCH: 25.3 pg — ABNORMAL LOW (ref 26.0–34.0)
MCHC: 30 g/dL (ref 30.0–36.0)
MCV: 84.4 fL (ref 80.0–100.0)
Platelets: 269 K/uL (ref 150–400)
RBC: 3.08 MIL/uL — ABNORMAL LOW (ref 3.87–5.11)
RDW: 16.2 % — ABNORMAL HIGH (ref 11.5–15.5)
WBC: 9.7 K/uL (ref 4.0–10.5)
nRBC: 0.5 % — ABNORMAL HIGH (ref 0.0–0.2)

## 2023-10-20 MED ORDER — LACTATED RINGERS IV BOLUS
1000.0000 mL | Freq: Once | INTRAVENOUS | Status: AC
  Administered 2023-10-20: 1000 mL via INTRAVENOUS

## 2023-10-20 NOTE — MAU Provider Note (Signed)
 History     CSN: [id number]  Arrival date and time: 10/20/23 1249   Event Date/Time   First Provider Initiated Contact with Patient 10/20/23 1335      Chief Complaint  Patient presents with   Hypotension   HPI [name] is a 18 y.o. year old G12P0010 female at [redacted]w[redacted]d weeks gestation who presents to MAU via EMS from her OB appt today reporting low BPs and feeling faint while in the office. She reports she was on the monitor and started feeling hot. She thinks she blacked out. She reports feeling better than earlier. She reports she at 2 bowls of cereal at 0900 this morning so that she could make it on time to her 1055 appt. Her pregnancy is complicated by ICP and being underweight. She is scheduled for an IOL for ICP on 10/25/2023.  OB History     Gravida  2   Para  0   Term  0   Preterm  0   AB  1   Living  0      SAB  0   IAB  1   Ectopic  0   Multiple  0   Live Births  0           Past Medical History:  Diagnosis Date   Anemia    Anxiety    Asthma    Depression     Past Surgical History:  Procedure Laterality Date   WISDOM TOOTH EXTRACTION      Family History  Problem Relation Age of Onset   Healthy Mother    Healthy Father    Asthma Sister    Asthma Maternal Grandmother     Social History   Tobacco Use   Smoking status: Never    Passive exposure: Never   Smokeless tobacco: Never  Vaping Use   Vaping status: Never Used  Substance Use Topics   Alcohol use: Not Currently   Drug use: Not Currently    Types: Marijuana    Allergies: No Known Allergies  Medications Prior to Admission  Medication Sig Dispense Refill Last Dose/Taking   Prenatal Vit-Fe Fumarate-FA (PREPLUS) 27-1 MG TABS Take 1 tablet by mouth daily. 30 tablet 13 Past Week   ursodiol  (ACTIGALL ) 500 MG tablet Take 1 tablet (500 mg total) by mouth 2 (two) times daily. 60 tablet 3 10/19/2023 Evening   hydrOXYzine  (ATARAX ) 25 MG tablet Take 1  tablet (25 mg total) by mouth every 6 (six) hours as needed for itching. 30 tablet 2 Unknown   terconazole  (TERAZOL 3 ) 0.8 % vaginal cream Place 1 applicator vaginally at bedtime. Apply nightly for three nights. 20 g 0     Review of Systems  Constitutional: Negative.   HENT: Negative.    Eyes: Negative.   Respiratory: Negative.    Cardiovascular: Negative.   Gastrointestinal: Negative.   Endocrine: Negative.   Genitourinary: Negative.   Musculoskeletal: Negative.   Skin: Negative.   Allergic/Immunologic: Negative.   Neurological: Negative.   Hematological: Negative.   Psychiatric/Behavioral: Negative.     Physical Exam   Patient Vitals for the past 24 hrs:  BP Temp Temp src Pulse Resp SpO2 Height Weight  10/20/23 1331 103/60 -- -- 93 -- -- -- --  10/20/23 1316 100/62 -- -- (!) 101 -- -- -- --  10/20/23 1258 111/66 98.2 F (36.8 C) Oral (!) 117 18 100 % 5' 3 (1.6 m) 59.4 kg     Physical Exam  Vitals and nursing note reviewed. Exam conducted with a chaperone present.  Constitutional:      Appearance: Normal appearance. She is underweight.  Pulmonary:     Effort: Pulmonary effort is normal.  Genitourinary:    Comments: Not indicated Musculoskeletal:        General: Normal range of motion.  Skin:    General: Skin is warm and dry.  Neurological:     Mental Status: She is alert and oriented to person, place, and time.  Psychiatric:        Mood and Affect: Mood normal.        Behavior: Behavior normal.        Thought Content: Thought content normal.        Judgment: Judgment normal.    REACTIVE NST - FHR: 140 bpm / moderate variability / accels present / decels absent / TOCO: none  MAU Course  Procedures  MDM CEFM Start IV LR 1 liter bolus CBC CMP  Regular Diet  Assessment and Plan  1. Dehydration during pregnancy (Primary) - Feeling better after IVFs and regular diet  2. Near syncope - No episode since the office  3. Orthostatic hypotension - BP  WNL  4. [redacted] weeks gestation of pregnancy   - Discharge home - Keep IOL appt on 10/25/2023 - Patient verbalized an understanding of the plan of care and agrees.   Ala Cart, CNM 10/20/2023, 1:49 PM

## 2023-10-20 NOTE — Progress Notes (Signed)
 Subjective:  Angeliah Castaneda is a 18 y.o. G2P0010 at [redacted]w[redacted]d being seen today for ongoing prenatal care.  She is currently monitored for the following issues for this high-risk pregnancy and has At risk for tuberculosis; Suicide ideation; Severe recurrent major depression without psychotic features (HCC); Tylenol  overdose; Suicide attempt [facility]); MDD (major depressive disorder), severe (HCC); Vomiting complicating pregnancy; Underweight; Encounter for supervision of normal pregnancy in teen primigravida, antepartum; [redacted] weeks gestation of pregnancy; Vaginal discharge during pregnancy in third trimester; and Cholestasis during pregnancy on their problem list.  Patient reports dizziness in office and felt faint, almost passing out in exam room.  She states that this has never happened before..   .  .   . Denies leaking of fluid.   The following portions of the patient's history were reviewed and updated as appropriate: allergies, current medications, past family history, past medical history, past social history, past surgical history and problem list. Problem list updated.  Objective:  There were no vitals filed for this visit.  Fetal Status:           General:  Alert, oriented and cooperative. Patient is in no acute distress.  Skin: Skin is warm and dry. No rash noted.   Cardiovascular: Normal heart rate noted  Respiratory: Normal respiratory effort, no problems with respiration noted  Abdomen: Soft, gravid, appropriate for gestational age.       Pelvic:  Cervical exam deferred        Extremities: Normal range of motion.     Mental Status: Normal mood and affect. Normal behavior. Normal judgment and thought content.   Urinalysis:      Assessment and Plan:  Pregnancy: G2P0010 at [redacted]w[redacted]d  1. Supervision of high risk pregnancy, antepartum (Primary) Rx: - Strep Gp B NAA - Cervicovaginal ancillary only( [place])  2. Cholestasis during pregnancy in third trimester Rx: - Fetal  nonstress test:  Reactive.  FHR 140-155 bpm.  Good variability.      15x15 accels.  No decels. - IOL at 37 weeks  3. Underweight  4. Severe recurrent major depression without psychotic features (HCC) - clinically stable  5. Suicide ideation - clinically stable  6. Syncope, unspecified syncope type - persistent hypotension despite po fluids - fetal monitoring reassuring - patient sent to MAU by ambulance for further observation    Preterm labor symptoms and general obstetric precautions including but not limited to vaginal bleeding, contractions, leaking of fluid and fetal movement were reviewed in detail with the patient. Please refer to After Visit Summary for other counseling recommendations.   Return in about 1 week (around 10/27/2023).   Rudy Carlin LABOR, MD 10/20/2023

## 2023-10-20 NOTE — MAU Note (Signed)
..  Rex Fredrick Pique Roemer is a 18 y.o. at [redacted]w[redacted]d here in MAU reporting: episode of hypotension after lying supine for NST at office. Felt like she blacked out, got hot. Was repositioned at office but blood pressure remained low so she was transferred via EMS.   Pregnancy monitored for cholestatsis, induction scheduled for Sunday 8/24 for cholestatsis.  Pain score: none Vitals:   10/20/23 1258  BP: 111/66  Pulse: (!) 117  Resp: 18  Temp: 98.2 F (36.8 C)  SpO2: 100%     FHT:150 Lab orders placed from triage:  none

## 2023-10-20 NOTE — Progress Notes (Addendum)
 Pt was in the office today at 1100, reporting feeling dizzy, nauseous and faint on NST. Pt stated that she felt like she needed to have a bowel movement and almost fainted while attempted to go to the bathroom. Pt was on left side and BP was monitored until EMS arrived ranging from 68/35- 83/52.

## 2023-10-22 ENCOUNTER — Encounter (HOSPITAL_COMMUNITY): Payer: Self-pay

## 2023-10-25 ENCOUNTER — Encounter (HOSPITAL_COMMUNITY): Payer: Self-pay | Admitting: Obstetrics and Gynecology

## 2023-10-25 ENCOUNTER — Other Ambulatory Visit: Payer: Self-pay

## 2023-10-25 ENCOUNTER — Inpatient Hospital Stay (HOSPITAL_COMMUNITY): Admitting: Anesthesiology

## 2023-10-25 ENCOUNTER — Inpatient Hospital Stay (HOSPITAL_COMMUNITY)

## 2023-10-25 ENCOUNTER — Inpatient Hospital Stay (HOSPITAL_COMMUNITY)
Admission: RE | Admit: 2023-10-25 | Discharge: 2023-10-27 | DRG: 807 | Disposition: A | Discharge status: Home / Self Care | Attending: Family Medicine | Admitting: Family Medicine

## 2023-10-25 DIAGNOSIS — R636 Underweight: Principal | ICD-10-CM

## 2023-10-25 DIAGNOSIS — O99344 Other mental disorders complicating childbirth: Secondary | ICD-10-CM | POA: Diagnosis not present

## 2023-10-25 DIAGNOSIS — Z87891 Personal history of nicotine dependence: Secondary | ICD-10-CM

## 2023-10-25 DIAGNOSIS — O9902 Anemia complicating childbirth: Secondary | ICD-10-CM | POA: Diagnosis present

## 2023-10-25 DIAGNOSIS — O26649 Intrahepatic cholestasis of pregnancy, unspecified trimester: Secondary | ICD-10-CM

## 2023-10-25 DIAGNOSIS — Z3A37 37 weeks gestation of pregnancy: Secondary | ICD-10-CM

## 2023-10-25 DIAGNOSIS — O2662 Liver and biliary tract disorders in childbirth: Secondary | ICD-10-CM | POA: Diagnosis not present

## 2023-10-25 DIAGNOSIS — O26643 Intrahepatic cholestasis of pregnancy, third trimester: Secondary | ICD-10-CM | POA: Diagnosis present

## 2023-10-25 DIAGNOSIS — Z3490 Encounter for supervision of normal pregnancy, unspecified, unspecified trimester: Secondary | ICD-10-CM | POA: Diagnosis present

## 2023-10-25 DIAGNOSIS — F332 Major depressive disorder, recurrent severe without psychotic features: Secondary | ICD-10-CM

## 2023-10-25 LAB — COMPREHENSIVE METABOLIC PANEL WITH GFR
ALT: 9 U/L (ref 0–44)
AST: 20 U/L (ref 15–41)
Albumin: 2.6 g/dL — ABNORMAL LOW (ref 3.5–5.0)
Alkaline Phosphatase: 92 U/L (ref 38–126)
Anion gap: 11 (ref 5–15)
BUN: 8 mg/dL (ref 6–20)
CO2: 23 mmol/L (ref 22–32)
Calcium: 9.4 mg/dL (ref 8.9–10.3)
Chloride: 102 mmol/L (ref 98–111)
Creatinine, Ser: 0.54 mg/dL (ref 0.44–1.00)
GFR, Estimated: >60 mL/min (ref >60)
Glucose, Bld: 103 mg/dL — ABNORMAL HIGH (ref 70–99)
Potassium: 3.8 mmol/L (ref 3.5–5.1)
Sodium: 136 mmol/L (ref 135–145)
Total Bilirubin: 0.5 mg/dL (ref 0.0–1.2)
Total Protein: 6.7 g/dL (ref 6.5–8.1)

## 2023-10-25 LAB — CBC
HCT: 28.8 % — ABNORMAL LOW (ref 36.0–46.0)
Hemoglobin: 8.9 g/dL — ABNORMAL LOW (ref 12.0–15.0)
MCH: 25.6 pg — ABNORMAL LOW (ref 26.0–34.0)
MCHC: 30.9 g/dL (ref 30.0–36.0)
MCV: 82.8 fL (ref 80.0–100.0)
Platelets: 346 K/uL (ref 150–400)
RBC: 3.48 MIL/uL — ABNORMAL LOW (ref 3.87–5.11)
RDW: 16.4 % — ABNORMAL HIGH (ref 11.5–15.5)
WBC: 11.4 K/uL — ABNORMAL HIGH (ref 4.0–10.5)
nRBC: 1.3 % — ABNORMAL HIGH (ref 0.0–0.2)

## 2023-10-25 LAB — GROUP B STREP BY PCR: Group B strep by PCR: NEGATIVE

## 2023-10-25 LAB — TYPE AND SCREEN
ABO/RH(D): O POS
Antibody Screen: NEGATIVE

## 2023-10-25 LAB — RPR: RPR Ser Ql: NONREACTIVE

## 2023-10-25 MED ORDER — SOD CITRATE-CITRIC ACID 500-334 MG/5ML PO SOLN: 30.0000 mL | ORAL | Status: DC | PRN

## 2023-10-25 MED ORDER — EPHEDRINE 5 MG/ML INJ: 10.0000 mg | INTRAVENOUS | Status: DC | PRN

## 2023-10-25 MED ORDER — COCONUT OIL OIL: 1.0000 | TOPICAL_OIL | Status: DC | PRN

## 2023-10-25 MED ORDER — ACETAMINOPHEN 325 MG PO TABS: 650.0000 mg | ORAL_TABLET | ORAL | Status: DC | PRN

## 2023-10-25 MED ORDER — OXYTOCIN-SODIUM CHLORIDE 30-0.9 UT/500ML-% IV SOLN: 2.5000 [IU]/h | INTRAVENOUS | Status: DC

## 2023-10-25 MED ORDER — SENNOSIDES-DOCUSATE SODIUM 8.6-50 MG PO TABS
2.0000 | ORAL_TABLET | Freq: Every day | ORAL | Status: DC
  Administered 2023-10-26 – 2023-10-27 (×2): 2 via ORAL
  Filled 2023-10-25 (×3): qty 2

## 2023-10-25 MED ORDER — ONDANSETRON HCL 4 MG PO TABS
4.0000 mg | ORAL_TABLET | ORAL | Status: DC | PRN
Start: 2023-10-25 — End: 2023-10-27

## 2023-10-25 MED ORDER — OXYTOCIN-SODIUM CHLORIDE 30-0.9 UT/500ML-% IV SOLN
1.0000 m[IU]/min | INTRAVENOUS | Status: DC
  Administered 2023-10-25: 2 m[IU]/min via INTRAVENOUS
  Filled 2023-10-25: qty 500

## 2023-10-25 MED ORDER — PRENATAL MULTIVITAMIN CH
1.0000 | ORAL_TABLET | Freq: Every day | ORAL | Status: DC
  Administered 2023-10-26 – 2023-10-27 (×2): 1 via ORAL
  Filled 2023-10-25 (×2): qty 1

## 2023-10-25 MED ORDER — ONDANSETRON HCL 4 MG/2ML IJ SOLN: 4.0000 mg | Freq: Four times a day (QID) | INTRAMUSCULAR | Status: DC | PRN

## 2023-10-25 MED ORDER — TERBUTALINE SULFATE 1 MG/ML IJ SOLN: 0.2500 mg | Freq: Once | INTRAMUSCULAR | Status: DC | PRN

## 2023-10-25 MED ORDER — OXYCODONE HCL 5 MG PO TABS: 10.0000 mg | ORAL_TABLET | ORAL | Status: DC | PRN

## 2023-10-25 MED ORDER — FENTANYL-BUPIVACAINE-NACL 0.5-0.125-0.9 MG/250ML-% EP SOLN
12.0000 mL/h | EPIDURAL | Status: DC | PRN
  Administered 2023-10-25: 12 mL/h via EPIDURAL
  Filled 2023-10-25: qty 250

## 2023-10-25 MED ORDER — ONDANSETRON HCL 4 MG/2ML IJ SOLN
4.0000 mg | INTRAMUSCULAR | Status: DC | PRN
Start: 2023-10-25 — End: 2023-10-27

## 2023-10-25 MED ORDER — DIPHENHYDRAMINE HCL 50 MG/ML IJ SOLN: 12.5000 mg | INTRAMUSCULAR | Status: DC | PRN

## 2023-10-25 MED ORDER — DIPHENHYDRAMINE HCL 25 MG PO CAPS: 25.0000 mg | ORAL_CAPSULE | Freq: Four times a day (QID) | ORAL | Status: DC | PRN

## 2023-10-25 MED ORDER — PHENYLEPHRINE 80 MCG/ML (10ML) SYRINGE FOR IV PUSH (FOR BLOOD PRESSURE SUPPORT)
80.0000 ug | PREFILLED_SYRINGE | INTRAVENOUS | Status: DC | PRN
  Filled 2023-10-25: qty 10

## 2023-10-25 MED ORDER — OXYCODONE-ACETAMINOPHEN 5-325 MG PO TABS: 1.0000 | ORAL_TABLET | ORAL | Status: DC | PRN

## 2023-10-25 MED ORDER — ACETAMINOPHEN 325 MG PO TABS
650.0000 mg | ORAL_TABLET | ORAL | Status: DC | PRN
Start: 2023-10-25 — End: 2023-10-27
  Administered 2023-10-26 – 2023-10-27 (×2): 650 mg via ORAL
  Filled 2023-10-25 (×2): qty 2

## 2023-10-25 MED ORDER — LACTATED RINGERS IV SOLN: 500.0000 mL | Freq: Once | INTRAVENOUS | Status: DC

## 2023-10-25 MED ORDER — LIDOCAINE-EPINEPHRINE (PF) 1.5 %-1:200000 IJ SOLN
INTRAMUSCULAR | Status: DC | PRN
  Administered 2023-10-25: 5 mL via EPIDURAL

## 2023-10-25 MED ORDER — BENZOCAINE-MENTHOL 20-0.5 % EX AERO
1.0000 | INHALATION_SPRAY | CUTANEOUS | Status: DC | PRN
  Administered 2023-10-26 – 2023-10-27 (×2): 1 via TOPICAL
  Filled 2023-10-25 (×2): qty 56

## 2023-10-25 MED ORDER — FENTANYL CITRATE (PF) 100 MCG/2ML IJ SOLN
50.0000 ug | INTRAMUSCULAR | Status: DC | PRN
  Administered 2023-10-25: 100 ug via INTRAVENOUS
  Filled 2023-10-25: qty 2

## 2023-10-25 MED ORDER — WITCH HAZEL-GLYCERIN EX PADS: 1.0000 | MEDICATED_PAD | CUTANEOUS | Status: DC | PRN

## 2023-10-25 MED ORDER — PHENYLEPHRINE 80 MCG/ML (10ML) SYRINGE FOR IV PUSH (FOR BLOOD PRESSURE SUPPORT): 80.0000 ug | PREFILLED_SYRINGE | INTRAVENOUS | Status: DC | PRN

## 2023-10-25 MED ORDER — DIBUCAINE (PERIANAL) 1 % EX OINT
1.0000 | TOPICAL_OINTMENT | CUTANEOUS | Status: DC | PRN
Start: 2023-10-25 — End: 2023-10-27

## 2023-10-25 MED ORDER — LIDOCAINE HCL (PF) 1 % IJ SOLN: 30.0000 mL | INTRAMUSCULAR | Status: DC | PRN

## 2023-10-25 MED ORDER — LACTATED RINGERS IV SOLN
500.0000 mL | INTRAVENOUS | Status: DC | PRN
  Administered 2023-10-25: 500 mL via INTRAVENOUS

## 2023-10-25 MED ORDER — OXYTOCIN BOLUS FROM INFUSION
333.0000 mL | Freq: Once | INTRAVENOUS | Status: AC
  Administered 2023-10-25: 333 mL via INTRAVENOUS

## 2023-10-25 MED ORDER — FERROUS SULFATE 325 (65 FE) MG PO TABS
325.0000 mg | ORAL_TABLET | Freq: Every day | ORAL | Status: DC
  Administered 2023-10-26 – 2023-10-27 (×2): 325 mg via ORAL
  Filled 2023-10-25 (×2): qty 1

## 2023-10-25 MED ORDER — OXYCODONE-ACETAMINOPHEN 5-325 MG PO TABS: 2.0000 | ORAL_TABLET | ORAL | Status: DC | PRN

## 2023-10-25 MED ORDER — IBUPROFEN 600 MG PO TABS
600.0000 mg | ORAL_TABLET | Freq: Four times a day (QID) | ORAL | Status: DC
  Administered 2023-10-25 – 2023-10-27 (×7): 600 mg via ORAL
  Filled 2023-10-25 (×7): qty 1

## 2023-10-25 MED ORDER — ZOLPIDEM TARTRATE 5 MG PO TABS
5.0000 mg | ORAL_TABLET | Freq: Every evening | ORAL | Status: DC | PRN
Start: 2023-10-25 — End: 2023-10-27

## 2023-10-25 MED ORDER — OXYCODONE HCL 5 MG PO TABS
5.0000 mg | ORAL_TABLET | ORAL | Status: DC | PRN
  Administered 2023-10-27: 5 mg via ORAL
  Filled 2023-10-25: qty 1

## 2023-10-25 MED ORDER — SIMETHICONE 80 MG PO CHEW
80.0000 mg | CHEWABLE_TABLET | ORAL | Status: DC | PRN
Start: 2023-10-25 — End: 2023-10-27

## 2023-10-25 MED ORDER — LACTATED RINGERS IV SOLN: INTRAVENOUS | Status: DC

## 2023-10-25 NOTE — Progress Notes (Signed)
 Labor Progress Note Tina Castaneda is a 18 y.o. G2P0010 at [redacted]w[redacted]d presented for IOL for cholestasis S: Patient is doing well and has started to feel some rectal pressure  O:  BP 102/67   Pulse (!) 105   Temp 98.6 F (37 C)   Resp 16   Ht 5' 3 (1.6 m)   Wt 60.1 kg   LMP 02/07/2023 (Exact Date)   SpO2 99%   BMI 23.49 kg/m  EFM: 140/moderate variability/+accels, 1 late decel resolved with position change  CVE: Dilation: 5.5 Effacement (%): 90 Cervical Position: Middle Station: 0 Presentation: Vertex Exam by:: Lauraine Merles, RN   A&P: 18 y.o. G2P0010 [redacted]w[redacted]d admitted for IOL 2/2 cholestasis now s/p cook, AROM, and on pitocin  #Pain: epidural in place #FWB: Current category 1 #GBS negative #Cholestasis of pregnancy #MDD w hx suicide attempt   Coolidge Moros, MD 9:53 AM

## 2023-10-25 NOTE — Anesthesia Preprocedure Evaluation (Signed)
 Anesthesia Evaluation  Patient identified by MRN, date of birth, ID band Patient awake    Reviewed: Allergy & Precautions, NPO status , Patient's Chart, lab work & pertinent test results  Airway Mallampati: II  TM Distance: >3 FB Neck ROM: Full    Dental no notable dental hx.    Pulmonary asthma    Pulmonary exam normal        Cardiovascular negative cardio ROS  Rhythm:Regular Rate:Normal     Neuro/Psych   Anxiety Depression    negative neurological ROS     GI/Hepatic negative GI ROS, Neg liver ROS,,,  Endo/Other  negative endocrine ROS    Renal/GU negative Renal ROS  negative genitourinary   Musculoskeletal   Abdominal Normal abdominal exam  (+)   Peds  Hematology  (+) Blood dyscrasia, anemia Lab Results      Component                Value               Date                      WBC                      11.4 (H)            10/25/2023                HGB                      8.9 (L)             10/25/2023                HCT                      28.8 (L)            10/25/2023                MCV                      82.8                10/25/2023                PLT                      346                 10/25/2023              Anesthesia Other Findings   Reproductive/Obstetrics (+) Pregnancy                              Anesthesia Physical Anesthesia Plan  ASA: 2  Anesthesia Plan: Epidural   Post-op Pain Management:    Induction:   PONV Risk Score and Plan: 2 and Treatment may vary due to age or medical condition  Airway Management Planned: Natural Airway  Additional Equipment: None  Intra-op Plan:   Post-operative Plan:   Informed Consent: I have reviewed the patients History and Physical, chart, labs and discussed the procedure including the risks, benefits and alternatives for the proposed anesthesia with the patient or authorized representative who has indicated  his/her understanding and acceptance.     Dental  advisory given  Plan Discussed with:   Anesthesia Plan Comments:         Anesthesia Quick Evaluation

## 2023-10-25 NOTE — Discharge Summary (Shared)
 Postpartum Discharge Summary  Date of Service updated***     Patient Name: Tina Castaneda DOB: [birth year] MRN: [id number]  Date of admission: 10/25/2023 Delivery date:10/25/2023 Delivering provider: BARBRA LANG PARAS Date of discharge: 10/25/2023  Admitting diagnosis: Encounter for induction of labor [Z34.90] Intrauterine pregnancy: 102w1d     Secondary diagnosis:  Principal Problem:   Normal vaginal delivery Active Problems:   Encounter for induction of labor  Additional problems: Cholestasis of pregnancy, MDD with hx suicide attempt, hx tobacco use    Discharge diagnosis: Term Pregnancy Delivered                                              Post partum procedures:{Postpartum procedures:23558} Augmentation: AROM, Pitocin , and IP Foley Complications: None  Hospital course: Induction of Labor With Vaginal Delivery   18 y.o. yo G2P0010 at [redacted]w[redacted]d was admitted to the hospital 10/25/2023 for induction of labor.  Indication for induction: Cholestasis of pregnancy.  Patient had an uncomplicated labor course complicated Membrane Rupture Time/Date: 7:45 AM,10/25/2023  Delivery Method:Vaginal, Spontaneous Operative Delivery:N/A Episiotomy: None Lacerations:  None Details of delivery can be found in separate delivery note.  Patient had a postpartum course complicated by***. Patient is discharged home 10/25/23.  Newborn Data: Birth date:10/25/2023 Birth time:1:28 PM Gender:Female Living status:Living Apgars: ,  Weight:   Magnesium  Sulfate received: No BMZ received: No Rhophylac:N/A MMR:N/A T-DaP:Given prenatally Flu: No RSV Vaccine received: No Transfusion:{Transfusion received:30440034}  Immunizations received: Immunization History  Administered Date(s) Administered   Pfizer Covid-19 Vaccine Bivalent Booster 66yrs & up 07/03/2021   Tdap 08/31/2023    Physical exam  Vitals:   10/25/23 1000 10/25/23 1030 10/25/23 1100 10/25/23 1200  BP: (!) 113/56 100/64  98/62 117/71  Pulse: 96 (!) 108 (!) 120 (!) 121  Resp:      Temp:   98.1 F (36.7 C)   TempSrc:      SpO2:      Weight:      Height:       General: {Exam; general:21111117} Lochia: {Desc; appropriate/inappropriate:30686::appropriate} Uterine Fundus: {Desc; firm/soft:30687} Incision: {Exam; incision:21111123} DVT Evaluation: {Exam; dvt:2111122} Labs: Lab Results  Component Value Date   WBC 11.4 (H) 10/25/2023   HGB 8.9 (L) 10/25/2023   HCT 28.8 (L) 10/25/2023   MCV 82.8 10/25/2023   PLT 346 10/25/2023      Latest Ref Rng & Units 10/25/2023   12:45 AM  CMP  Glucose 70 - 99 mg/dL 896   BUN 6 - 20 mg/dL 8   Creatinine 9.55 - 8.99 mg/dL 9.45   Sodium 864 - 854 mmol/L 136   Potassium 3.5 - 5.1 mmol/L 3.8   Chloride 98 - 111 mmol/L 102   CO2 22 - 32 mmol/L 23   Calcium 8.9 - 10.3 mg/dL 9.4   Total Protein 6.5 - 8.1 g/dL 6.7   Total Bilirubin 0.0 - 1.2 mg/dL 0.5   Alkaline Phos 38 - 126 U/L 92   AST 15 - 41 U/L 20   ALT 0 - 44 U/L 9    Edinburgh Score:     No data to display         No data recorded  After visit meds:  Allergies as of 10/25/2023   No Known Allergies   Med Rec must be completed prior to using this [facility]***  Discharge home in stable condition Infant Feeding: Bottle and Breast Infant Disposition:home with mother Discharge instruction: per After Visit Summary and Postpartum booklet. Activity: Advance as tolerated. Pelvic rest for 6 weeks.  Diet: routine diet Future Appointments: Future Appointments  Date Time Provider Department Center  10/27/2023  9:35 AM Zina Jerilynn LABOR, MD [facility] None   Follow up Visit: Message sent to [facility] 10/25/23  Please schedule this patient for a In person postpartum visit in 4 weeks with the following provider: Any provider. Additional Postpartum F/U:Postpartum Depression checkup in 2 weeks High risk pregnancy complicated by: cholestasis Delivery mode:  Vaginal, Spontaneous Anticipated Birth  Control:  PP Nexplanon placed   10/25/2023 Coolidge Moros, MD

## 2023-10-25 NOTE — Anesthesia Procedure Notes (Signed)
 Epidural Patient location during procedure: OB Start time: 10/25/2023 3:30 AM End time: 10/25/2023 3:40 AM  Staffing Anesthesiologist: Dorethea Cordella SQUIBB, DO Performed: anesthesiologist   Preanesthetic Checklist Completed: patient identified, IV checked, site marked, risks and benefits discussed, surgical consent, monitors and equipment checked, pre-op evaluation and timeout performed  Epidural Patient position: sitting Prep: ChloraPrep Patient monitoring: heart rate, continuous pulse ox and blood pressure Approach: midline Location: L3-L4 Injection technique: LOR saline  Needle:  Needle type: Tuohy  Needle gauge: 17 G Needle length: 9 cm Needle insertion depth: 5 cm Catheter type: closed end flexible Catheter size: 20 Guage Catheter at skin depth: 11 cm Test dose: negative and 1.5% lidocaine  with Epi 1:200 K  Assessment Events: blood not aspirated, no cerebrospinal fluid, injection not painful, no injection resistance and no paresthesia  Additional Notes Patient identified. Risks/Benefits/Options discussed with patient including but not limited to bleeding, infection, nerve damage, paralysis, failed block, incomplete pain control, headache, blood pressure changes, nausea, vomiting, reactions to medications, itching and postpartum back pain. Confirmed with bedside nurse the patient's most recent platelet count. Confirmed with patient that they are not currently taking any anticoagulation, have any bleeding history or any family history of bleeding disorders. Patient expressed understanding and wished to proceed. All questions were answered. Sterile technique was used throughout the entire procedure. Please see nursing notes for vital signs. Test dose was given through epidural catheter and negative prior to continuing to dose epidural or start infusion. Warning signs of high block given to the patient including shortness of breath, tingling/numbness in hands, complete motor block,  or any concerning symptoms with instructions to call for help. Patient was given instructions on fall risk and not to get out of bed. All questions and concerns addressed with instructions to call with any issues or inadequate analgesia.    Reason for block:procedure for pain

## 2023-10-25 NOTE — Procedures (Signed)
 LABOR COURSE 18 y.o. female G2P0010 with IUP at [redacted]w[redacted]d by LMP c/w US  at 11wk presenting for IOL due to cholestasis   Delivery Note Called to room and patient was complete and pushing. Head delivered ROA. No nuchal cord present. Shoulder and body delivered in usual fashion. At  1328 a viable female was delivered via Vaginal, Spontaneous (Presentation: vertex).  Infant with spontaneous cry, placed on mother's abdomen, dried and stimulated. Cord clamped x 2 after >1-minute delay, and cut by FOB. Cord blood drawn. Placenta delivered spontaneously with gentle cord traction and trailing membranes. Appears intact. Fundus firm with massage and Pitocin . Labia, perineum, vagina, and cervix inspected with no lacerations.    APGAR: 9 ,9 ; 2840g Cord: 3VC with the following complications: none.    Anesthesia:  epidural Episiotomy: None Lacerations: none Suture Repair: NA Est. Blood Loss (mL):  Mom to postpartum.  Baby to Couplet care / Skin to Skin.  Lona Merritts, MD 10/25/23 2:06 PM

## 2023-10-25 NOTE — Lactation Note (Signed)
 This note was copied from a baby's chart. Lactation Consultation Note  Patient Name: Tina Castaneda Unijb'd Date: 10/25/2023 Age:20 hours  Mom chooses to formula feed.   Maternal Data    Feeding Nipple Type: Slow - flow  LATCH Score                    Lactation Tools Discussed/Used    Interventions    Discharge    Consult Status Consult Status: Complete    Ben Sanz G 10/25/2023, 7:48 PM

## 2023-10-25 NOTE — Progress Notes (Addendum)
 Labor Progress Note Pachia Strum is a 18 y.o. G2P0010 at [redacted]w[redacted]d presented for IOL for cholestasis S:   O:  BP 109/60   Pulse 95   Temp 98 F (36.7 C) (Oral)   Resp 16   Ht 5' 3 (1.6 m)   Wt 60.1 kg   LMP 02/07/2023 (Exact Date)   SpO2 99%   BMI 23.49 kg/m  EFM: 130/mod variability/accels/no deccels  CVE: Dilation: 4 Effacement (%): 80 Cervical Position: Middle Station: 0 Presentation: Vertex Exam by:: Dr   A&P: 18 y.o. G2P0010 [redacted]w[redacted]d  presented for IOL for cholestasis #Labor: Progressing well. AROM discussed w pt and pt verbally consented. AROM performed at 7:45 with small amount of clear fluid. Continue on low dose pitocin  #Pain: Epidural in place #FWB: Cat 1 #GBS obtained #Cholestasis of pregnancy: Proceed with induction as planned   Buel Avers, MD 7:51 AM   Attestation of Supervision of Resident:  I confirm that I have verified the information documented in the resident's note and that I have also personally reperformed the history, physical exam and all medical decision making activities.  I was present during AROM by Dr. Avers. I have verified that all services and findings are accurately documented in this resident's note; and I agree with management and plan as outlined in the documentation. I have also made any necessary editorial changes.   Alain Sor, MD OB Fellow, Faculty Practice [facility], Center for [facility]

## 2023-10-25 NOTE — H&P (Cosign Needed Addendum)
 OBSTETRIC ADMISSION HISTORY AND PHYSICAL  Tina Castaneda is a 18 y.o. female G2P0010 with IUP at [redacted]w[redacted]d by LMP c/w US  at 11wk presenting for IOL due to cholestasis. She reports +FMs, No LOF, no VB, no blurry vision, headaches or peripheral edema, and RUQ pain.  She plans on formula feeding. She request IUD vs nexplanon outpt for birth control. She received her prenatal care at [facility]   Dating: By LMP --->  Estimated Date of Delivery: 11/14/23  Sono:    @[redacted]w[redacted]d , CWD, normal anatomy, cephalic presentation, posterior placental lie, 1154g, 31% EFW   Prenatal History/Complications: MDD w/ hx of SI, Cholestasis of pregnancy  Past Medical History: Past Medical History:  Diagnosis Date   Anemia    Anxiety    Asthma    Depression     Past Surgical History: Past Surgical History:  Procedure Laterality Date   WISDOM TOOTH EXTRACTION      Obstetrical History: OB History     Gravida  2   Para  0   Term  0   Preterm  0   AB  1   Living  0      SAB  0   IAB  1   Ectopic  0   Multiple  0   Live Births  0           Social History Social History   Socioeconomic History   Marital status: Single    Spouse name: Not on file   Number of children: Not on file   Years of education: Not on file   Highest education level: Not on file  Occupational History   Not on file  Tobacco Use   Smoking status: Never    Passive exposure: Never   Smokeless tobacco: Never  Vaping Use   Vaping status: Never Used  Substance and Sexual Activity   Alcohol use: Not Currently   Drug use: Not Currently    Types: Marijuana   Sexual activity: Yes    Birth control/protection: None  Other Topics Concern   Not on file  Social History Narrative   Lives at home with maternal grandmother. Per pt grandmother is legal guardian. No pets or smoking in the home.   Social Drivers of Corporate investment banker Strain: Low Risk  (03/22/2023)   [facility]    Overall Financial Resource Strain (CARDIA)    Difficulty of Paying Living Expenses: Not hard at all  Food Insecurity: No Food Insecurity (10/25/2023)   Hunger Vital Sign    Worried About Running Out of Food in the Last Year: Never true    Ran Out of Food in the Last Year: Never true  Transportation Needs: No Transportation Needs (10/25/2023)   PRAPARE - Administrator, Civil Service (Medical): No    Lack of Transportation (Non-Medical): No  Physical Activity: Insufficiently Active (03/22/2023)   Received from [facility]   Exercise Vital Sign    On average, how many days per week do you engage in moderate to strenuous exercise (like a brisk walk)?: 4 days    On average, how many minutes do you engage in exercise at this level?: 20 min  Stress: No Stress Concern Present (03/22/2023)   Received from [facility] - Occupational Stress Questionnaire    Feeling of Stress : Only a little  Social Connections: Socially Integrated (03/22/2023)   Received from [facility]  Social Network    How would you rate your social network (family, work, friends)?: Good participation with social networks    Family History: Family History  Problem Relation Age of Onset   Healthy Mother    Healthy Father    Asthma Sister    Asthma Maternal Grandmother     Allergies: No Known Allergies  Medications Prior to Admission  Medication Sig Dispense Refill Last Dose/Taking   hydrOXYzine  (ATARAX ) 25 MG tablet Take 1 tablet (25 mg total) by mouth every 6 (six) hours as needed for itching. 30 tablet 2 Past Month   Prenatal Vit-Fe Fumarate-FA (PREPLUS) 27-1 MG TABS Take 1 tablet by mouth daily. 30 tablet 13 10/24/2023   ursodiol  (ACTIGALL ) 500 MG tablet Take 1 tablet (500 mg total) by mouth 2 (two) times daily. 60 tablet 3 10/24/2023 Evening   terconazole  (TERAZOL 3 ) 0.8 % vaginal cream Place 1 applicator vaginally at bedtime. Apply nightly for three nights.  20 g 0 More than a month     Review of Systems   All systems reviewed and negative except as stated in HPI  Blood pressure 103/63, pulse (!) 134, temperature 98 F (36.7 C), temperature source Oral, resp. rate 16, height 5' 3 (1.6 m), weight 60.1 kg, last menstrual period 02/07/2023, SpO2 99%. General appearance: alert, cooperative, and no distress Lungs: clear to auscultation bilaterally Heart: regular rate and rhythm Abdomen: soft, non-tender; bowel sounds normal Pelvic: 1cm and soft on cervical check Extremities: Homans sign is negative, no sign of DVT Presentation: cephalic Fetal monitoringBaseline: 135 bpm, Variability: Good {> 6 bpm), Accelerations: Reactive, and Decelerations: Absent Uterine activityDate/time of Frequency: Every 2-4 minutes, and Duration: 50-70 seconds Dilation: 2.5 Effacement (%):  Station:  Exam byBETHA Carolin, MD   Prenatal labs: ABO, Rh: --/--/O POS (08/25 0059) Antibody: NEG (08/25 0059) Rubella: 3.56 (04/08 1017) RPR: Non Reactive (07/01 0837)  HBsAg: Negative (04/08 1017)  HIV: Non Reactive (07/01 0837)  GBS: PRESUMPTIVE NEGATIVE/-- (08/25 0045)    Lab Results  Component Value Date   GBS PRESUMPTIVE NEGATIVE 10/25/2023   GTT normal Genetic screening  Low risk Anatomy US  normal  Immunization History  Administered Date(s) Administered   Pfizer Covid-19 Vaccine Bivalent Booster 38yrs & up 07/03/2021   Tdap 08/31/2023    Prenatal Transfer Tool  Maternal Diabetes: No Genetic Screening: Normal Maternal Ultrasounds/Referrals: Normal Fetal Ultrasounds or other Referrals:  None Maternal Substance Abuse:  No Significant Maternal Medications:  none Significant Maternal Lab Results: Group B Strep negative Number of Prenatal Visits:greater than 3 verified prenatal visits Maternal Vaccinations:TDap Other Comments:  None   Results for orders placed or performed during the hospital encounter of 10/25/23 (from the past 24 hours)  Group B strep  by PCR   Collection Time: 10/25/23 12:45 AM   Specimen: Vaginal/Rectal; Genital  Result Value Ref Range   Group B strep by PCR PRESUMPTIVE NEGATIVE PRESUMPTIVE NEGATIVE  CBC   Collection Time: 10/25/23 12:45 AM  Result Value Ref Range   WBC 11.4 (H) 4.0 - 10.5 K/uL   RBC 3.48 (L) 3.87 - 5.11 MIL/uL   Hemoglobin 8.9 (L) 12.0 - 15.0 g/dL   HCT 71.1 (L) 63.9 - 53.9 %   MCV 82.8 80.0 - 100.0 fL   MCH 25.6 (L) 26.0 - 34.0 pg   MCHC 30.9 30.0 - 36.0 g/dL   RDW 83.5 (H) 88.4 - 84.4 %   Platelets 346 150 - 400 K/uL   nRBC 1.3 (H) 0.0 - 0.2 %  Comprehensive metabolic panel   Collection Time: 10/25/23 12:45 AM  Result Value Ref Range   Sodium 136 135 - 145 mmol/L   Potassium 3.8 3.5 - 5.1 mmol/L   Chloride 102 98 - 111 mmol/L   CO2 23 22 - 32 mmol/L   Glucose, Bld 103 (H) 70 - 99 mg/dL   BUN 8 6 - 20 mg/dL   Creatinine, Ser 9.45 0.44 - 1.00 mg/dL   Calcium 9.4 8.9 - 89.6 mg/dL   Total Protein 6.7 6.5 - 8.1 g/dL   Albumin 2.6 (L) 3.5 - 5.0 g/dL   AST 20 15 - 41 U/L   ALT 9 0 - 44 U/L   Alkaline Phosphatase 92 38 - 126 U/L   Total Bilirubin 0.5 0.0 - 1.2 mg/dL   GFR, Estimated >39 >39 mL/min   Anion gap 11 5 - 15  Type and screen [facility]   Collection Time: 10/25/23 12:59 AM  Result Value Ref Range   ABO/RH(D) O POS    Antibody Screen NEG    Sample Expiration      10/28/2023,2359 Performed at [facility], [address] [place], [place] [address]     Patient Active Problem List   Diagnosis Date Noted   Encounter for induction of labor 10/25/2023   Cholestasis during pregnancy 10/06/2023   Vaginal discharge during pregnancy in third trimester 08/27/2023   Vomiting complicating pregnancy 05/11/2023   Underweight 05/11/2023   Encounter for supervision of normal pregnancy in teen primigravida, antepartum 05/11/2023   Tylenol  overdose 10/27/2020   Suicide attempt (HCC)    Severe recurrent major depression without psychotic features (HCC) 08/28/2019    Suicide ideation 02/16/2018   MDD (major depressive disorder), severe (HCC) 02/16/2018   At risk for tuberculosis 10/30/2016    Assessment/Plan:  Kellsie Grindle is a 18 y.o. G2P0010 at [redacted]w[redacted]d here for IUP at [redacted]w[redacted]d by LMP c/w US  at 11wk presenting for IOL due to cholestasis.  #Labor: FB placed, start low dose Pitocin , plan for AROM once FB out #Pain: Epidural desired #FWB: Category 1 tracing, reassuring #GBS status:  PCR pend #Feeding: Formula #Reproductive Life planning: Nexplanon vs IUD  #Circ:  not applicable #Cholestasis of pregnancy: Proceed with induction as planned  Buel Avers, MD 10/25/2023, 5:17 AM   Attestation of Supervision of Resident:  I confirm that I have verified the information documented in the resident's note and that I have also personally reperformed the history, physical exam and all medical decision making activities.  Dr Erik placed FB. I have verified that all services and findings are accurately documented in this resident's note; and I agree with management and plan as outlined in the documentation. I have also made any necessary editorial changes.   Alain Sor, MD OB Fellow, Faculty Practice [facility], Center for [facility]

## 2023-10-26 ENCOUNTER — Other Ambulatory Visit (HOSPITAL_COMMUNITY): Payer: Self-pay

## 2023-10-26 MED ORDER — IBUPROFEN 600 MG PO TABS
600.0000 mg | ORAL_TABLET | Freq: Four times a day (QID) | ORAL | 0 refills | Status: AC
Start: 2023-10-26 — End: ?
  Filled 2023-10-26: qty 30, 8d supply, fill #0

## 2023-10-26 MED ORDER — FERROUS SULFATE 325 (65 FE) MG PO TABS
325.0000 mg | ORAL_TABLET | Freq: Every day | ORAL | 3 refills | Status: AC
  Filled 2023-10-26: qty 30, 30d supply, fill #0

## 2023-10-26 MED ORDER — ACETAMINOPHEN 325 MG PO TABS
650.0000 mg | ORAL_TABLET | ORAL | 0 refills | Status: AC | PRN
  Filled 2023-10-26: qty 30, 3d supply, fill #0

## 2023-10-26 MED ORDER — SENNOSIDES-DOCUSATE SODIUM 8.6-50 MG PO TABS
2.0000 | ORAL_TABLET | Freq: Every day | ORAL | 0 refills | Status: AC
  Filled 2023-10-26: qty 30, 15d supply, fill #0

## 2023-10-26 NOTE — Patient Instructions (Signed)
 If interested in an outpatient lactation consult in office or virtually please reach out to us  at [facility] (First Floor) [address], [place] Please call [phone] and press [phone] for lactation.    Lactation support groups:  [facility], Tuesdays 10:00 am -12:00 pm at [address] on the second floor in the conference room, lactating parents and lap babies welcome.  [email or web address]  [email or web address]  Tina Castaneda, [facility] Center for [facility]

## 2023-10-26 NOTE — Progress Notes (Signed)
 POSTPARTUM PROGRESS NOTE  Post Partum Day 1  Subjective:  Tina Castaneda is a 18 y.o. G2P1011 s/p SVD at [redacted]w[redacted]d.  She reports she is doing well. No acute events overnight. She denies any problems with ambulating, voiding or po intake. Denies nausea or vomiting.  Pain is well controlled.  Lochia is appropriate.  Objective: Blood pressure 114/63, pulse 81, temperature 97.8 F (36.6 C), resp. rate 16, height 5' 3 (1.6 m), weight 60.1 kg, last menstrual period 02/07/2023, SpO2 100%, unknown if currently breastfeeding.  Physical Exam:  General: alert, cooperative and no distress Chest: no respiratory distress Heart:regular rate, distal pulses intact Uterine Fundus: firm, appropriately tender DVT Evaluation: No calf swelling or tenderness Extremities: trace edema Skin: warm, dry  Recent Labs    10/25/23 0045  HGB 8.9*  HCT 28.8*    Assessment/Plan: Tina Castaneda is a 18 y.o. G2P1011 s/p SVD at [redacted]w[redacted]d   PPD#1 - Doing well  Routine postpartum care Contraception: unsure  Feeding: both Dispo: Plan for discharge today pending Peds.   LOS: 1 day   Augustin JAYSON Slade, MD OB Fellow  10/26/2023, 4:52 AM

## 2023-10-26 NOTE — Anesthesia Postprocedure Evaluation (Signed)
 Anesthesia Post Note  Patient: Tina Castaneda  Procedure(s) Performed: AN AD HOC LABOR EPIDURAL     Patient location during evaluation: Mother Baby Anesthesia Type: Epidural Level of consciousness: awake, oriented and awake and alert Pain management: pain level controlled Vital Signs Assessment: post-procedure vital signs reviewed and stable Respiratory status: spontaneous breathing, nonlabored ventilation and respiratory function stable Cardiovascular status: stable Postop Assessment: no headache, patient able to bend at knees, adequate PO intake, able to ambulate and no apparent nausea or vomiting Anesthetic complications: no   No notable events documented.  Last Vitals:  Vitals:   10/26/23 0100 10/26/23 0459  BP: 114/63 (!) 115/59  Pulse: 81 89  Resp: 16 16  Temp: 36.6 C 36.7 C  SpO2: 100% 100%    Last Pain:  Vitals:   10/26/23 0807  TempSrc:   PainSc: 0-No pain   Pain Goal:                   [name]

## 2023-10-27 ENCOUNTER — Other Ambulatory Visit (HOSPITAL_COMMUNITY): Payer: Self-pay

## 2023-10-27 ENCOUNTER — Encounter: Admitting: Obstetrics and Gynecology

## 2023-10-27 LAB — CULTURE, BETA STREP (GROUP B ONLY)

## 2023-10-27 NOTE — Social Work (Signed)
 CSW received consult for hx of Anxiety and Depression.  CSW met with MOB to offer support and complete assessment. CSW entered the room and observed MOB sitting on the bed and the infant  lying next to her. CSW introduced self, CSW role and reason for visit, MOB was agreeable to visit. CSW inquired about how MOB was feeling, MOB reported good. CSW inquired about MOB MH hx, MOB reported she was diagnosed with MDD in 2021. MOB reported the only thing that helped improve her mood was when she was in a behavioral health facility. MOB reported she has since used skills to regulate her emotions. MOB reported she also met with IBH therapist during her pregnancy, and found it extremely helpful.  MOB reported she is interested in finding an outpatient therapist, CSW provided MOB with resources for outpatient therapy. CSW assessed for safety, MOB denied any SI, HI or DV.  CSW provided education regarding the baby blues period vs. perinatal mood disorders, discussed treatment and gave resources for mental health follow up if concerns arise.  CSW recommends self-evaluation during the postpartum time period using the New Mom Checklist from Postpartum Progress and encouraged MOB to contact a medical professional if symptoms are noted at any time.  MOB reported she has a great support system including her mom grandmother and FOB.   CSW provided review of Sudden Infant Death Syndrome (SIDS) precautions.  MOB reported she has all essential items for the infant including a bassinet, crib and car seat  CSW identifies no further need for intervention and no barriers to discharge at this time.  Tina Castaneda, LCSWA Clinical Social Worker [phone]

## 2023-11-06 ENCOUNTER — Telehealth (HOSPITAL_COMMUNITY): Payer: Self-pay | Admitting: *Deleted

## 2023-11-06 NOTE — Telephone Encounter (Signed)
 Attempted hospital discharge follow-up call. Left message for patient to return RN call with any questions or concerns.Allean IVAR Carton, RN, 11/06/23, [phone]

## 2023-11-08 ENCOUNTER — Encounter: Admitting: Licensed Clinical Social Worker

## 2023-11-15 ENCOUNTER — Ambulatory Visit (INDEPENDENT_AMBULATORY_CARE_PROVIDER_SITE_OTHER): Admitting: Licensed Clinical Social Worker

## 2023-11-15 DIAGNOSIS — F4321 Adjustment disorder with depressed mood: Secondary | ICD-10-CM | POA: Diagnosis not present
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
## 2023-11-23 ENCOUNTER — Encounter: Admitting: Licensed Clinical Social Worker

## 2023-11-23 ENCOUNTER — Ambulatory Visit (INDEPENDENT_AMBULATORY_CARE_PROVIDER_SITE_OTHER): Admitting: Obstetrics and Gynecology

## 2023-11-23 DIAGNOSIS — Z3202 Encounter for pregnancy test, result negative: Secondary | ICD-10-CM

## 2023-11-23 DIAGNOSIS — Z30011 Encounter for initial prescription of contraceptive pills: Secondary | ICD-10-CM | POA: Diagnosis not present

## 2023-11-23 LAB — POCT URINE PREGNANCY: Preg Test, Ur: NEGATIVE

## 2023-11-23 MED ORDER — NORETHIN ACE-ETH ESTRAD-FE 1-20 MG-MCG(24) PO TABS: 1.0000 | ORAL_TABLET | Freq: Every day | ORAL | 11 refills | Status: DC

## 2023-11-23 NOTE — Progress Notes (Signed)
 ..   Post Partum Visit Note  Tina Castaneda is a 18 y.o. G55P1011 female who presents for a postpartum visit. She is 4 weeks postpartum following a normal spontaneous vaginal delivery.  I have fully reviewed the prenatal and intrapartum course. The delivery was at 37.1 gestational weeks.  Anesthesia: epidural. Postpartum course has been good. Baby is doing well. Baby is feeding by bottle - Similac total Comfort. Bleeding no bleeding. Bowel function is normal. Bladder function is normal. Patient is sexually active. Contraception method is OCP (estrogen/progesterone). Postpartum depression screening: negative.   The pregnancy intention screening data noted above was reviewed. Potential methods of contraception were discussed. The patient elected to proceed with No data recorded.    Health Maintenance Due  Topic Date Due   Hepatitis B Vaccines 19-59 Average Risk (2 of 3 - 3-dose series) 01/12/2005   HPV VACCINES (1 - 3-dose series) Never done   Meningococcal B Vaccine (1 of 2 - Standard) Never done   Pneumococcal Vaccine (1 of 2 - PCV) 05/12/2022   Influenza Vaccine  10/01/2023   COVID-19 Vaccine (2 - 2025-26 season) 11/01/2023    The following portions of the patient's history were reviewed and updated as appropriate: allergies, current medications, past family history, past medical history, past social history, past surgical history, and problem list.  Review of Systems Pertinent items are noted in HPI.  Objective:  Wt 110 lb 12.8 oz (50.3 kg)   LMP 02/07/2023 (Exact Date)   BMI 19.63 kg/m    General:  alert and cooperative   Breasts:  not indicated  Lungs: Normal effort     Abdomen: Nondistended       GU exam:  not indicated       Assessment:   1. Encounter for routine postpartum follow-up (Primary)  - POCT urine pregnancy  2. Encounter for initial prescription of contraceptive pills Discussed initiation, back up method, start in two weeks if neg UPT or  start cycle  - Norethindrone  Acetate-Ethinyl Estrad-FE (LOESTRIN  24 FE) 1-20 MG-MCG(24) tablet; Take 1 tablet by mouth daily.  Dispense: 28 tablet; Refill: 11   Plan:   Essential components of care per ACOG recommendations:  1.  Mood and well being: Patient with negative depression screening today. Reviewed local resources for support.  - Patient tobacco use? No.   - hx of drug use? No.    2. Infant care and feeding:  -Patient currently breastmilk feeding? No.  -Social determinants of health (SDOH) reviewed in EPIC. No concerns  3. Sexuality, contraception and birth spacing - Patient does not want a pregnancy in the next year.  - Reviewed reproductive life planning. Reviewed contraceptive methods based on pt preferences and effectiveness.  Patient desired Oral Contraceptive today.   - Discussed birth spacing of 18 months  4. Sleep and fatigue -Encouraged family/partner/community support of 4 hrs of uninterrupted sleep to help with mood and fatigue  5. Physical Recovery  - Discussed patients delivery and complications. She describes her labor as good. - Patient had a Vaginal, no problems at delivery. Patient had a none laceration. Perineal healing reviewed. Patient expressed understanding - Patient has urinary incontinence? No. - Patient is safe to resume physical and sexual activity  6.  Health Maintenance - HM due items addressed Yes - Last pap smear No results found for: DIAGPAP Pap smear not done at today's visit.  -Breast Cancer screening indicated? No.   7. Chronic Disease/Pregnancy Condition follow up: None  - PCP follow  up  Nidia Daring, FNP Center for [employer], [facility]

## 2023-12-15 ENCOUNTER — Telehealth: Payer: Self-pay

## 2023-12-15 ENCOUNTER — Other Ambulatory Visit: Payer: Self-pay | Admitting: Obstetrics and Gynecology

## 2023-12-15 MED ORDER — TWIRLA 120-30 MCG/24HR TD PTWK: MEDICATED_PATCH | TRANSDERMAL | 11 refills | Status: AC

## 2023-12-15 NOTE — Telephone Encounter (Signed)
 Returned call and pt states that she has been having heavy bleeding every day since starting BC pills on 9/26. Pt wants to switch to BC patch, consulted with provider in office and advised rx would be sent. Advised pt to continue pills until end of week and start patch on Sunday, pt voiced understanding.

## 2023-12-15 NOTE — Progress Notes (Signed)
 Rx sent for patch, d/c OCP

## 2024-03-15 ENCOUNTER — Emergency Department (HOSPITAL_BASED_OUTPATIENT_CLINIC_OR_DEPARTMENT_OTHER): Payer: MEDICAID

## 2024-03-15 ENCOUNTER — Emergency Department (HOSPITAL_BASED_OUTPATIENT_CLINIC_OR_DEPARTMENT_OTHER)
Admission: EM | Admit: 2024-03-15 | Discharge: 2024-03-15 | Disposition: A | Discharge status: Home / Self Care | Payer: MEDICAID | Attending: Emergency Medicine | Admitting: Emergency Medicine

## 2024-03-15 ENCOUNTER — Other Ambulatory Visit: Payer: Self-pay

## 2024-03-15 DIAGNOSIS — O209 Hemorrhage in early pregnancy, unspecified: Secondary | ICD-10-CM | POA: Insufficient documentation

## 2024-03-15 DIAGNOSIS — Z3A22 22 weeks gestation of pregnancy: Secondary | ICD-10-CM | POA: Insufficient documentation

## 2024-03-15 LAB — CBC WITH DIFFERENTIAL/PLATELET
Abs Immature Granulocytes: 0.02 K/uL (ref 0.00–0.07)
Basophils Absolute: 0 K/uL (ref 0.0–0.1)
Basophils Relative: 1 %
Eosinophils Absolute: 0.1 K/uL (ref 0.0–0.5)
Eosinophils Relative: 1 %
HCT: 34.2 % — ABNORMAL LOW (ref 36.0–46.0)
Hemoglobin: 11.1 g/dL — ABNORMAL LOW (ref 12.0–15.0)
Immature Granulocytes: 0 %
Lymphocytes Relative: 40 %
Lymphs Abs: 2.6 K/uL (ref 0.7–4.0)
MCH: 28.8 pg (ref 26.0–34.0)
MCHC: 32.5 g/dL (ref 30.0–36.0)
MCV: 88.8 fL (ref 80.0–100.0)
Monocytes Absolute: 0.3 K/uL (ref 0.1–1.0)
Monocytes Relative: 5 %
Neutro Abs: 3.4 K/uL (ref 1.7–7.7)
Neutrophils Relative %: 53 %
Platelets: 339 K/uL (ref 150–400)
RBC: 3.85 MIL/uL — ABNORMAL LOW (ref 3.87–5.11)
RDW: 14.6 % (ref 11.5–15.5)
WBC: 6.5 K/uL (ref 4.0–10.5)
nRBC: 0 % (ref 0.0–0.2)

## 2024-03-15 LAB — HCG, QUANTITATIVE, PREGNANCY: hCG, Beta Chain, Quant, S: 376 m[IU]/mL — ABNORMAL HIGH

## 2024-03-15 NOTE — ED Triage Notes (Signed)
 States had positive pregnancy test last week.  Presently having vaginal bleeding.  States has gone through 4 - 6 pads in 24 hours. Having clots.

## 2024-03-15 NOTE — ED Provider Notes (Incomplete)
 " [place] EMERGENCY DEPARTMENT AT [facility] Provider Note   CSN: [id number] Arrival date & time: 03/15/24  1801     Patient presents with: Vaginal Bleeding   Tina Castaneda is a 19 y.o. female.       Prior to Admission medications  Medication Sig Start Date End Date Taking? Authorizing Provider  acetaminophen  (TYLENOL ) 325 MG tablet Take 2 tablets (650 mg total) by mouth every 4 (four) hours as needed (for pain scale < 4). Patient not taking: Reported on 11/23/2023 10/26/23   Jhonny Augustin BROCKS, MD  ferrous sulfate  325 (65 FE) MG tablet Take 1 tablet (325 mg total) by mouth daily with breakfast. Patient not taking: Reported on 11/23/2023 10/26/23   Jhonny Augustin BROCKS, MD  hydrOXYzine  (ATARAX ) 25 MG tablet Take 1 tablet (25 mg total) by mouth every 6 (six) hours as needed for itching. Patient not taking: Reported on 11/23/2023 10/18/23   Constant, Peggy, MD  ibuprofen  (ADVIL ) 600 MG tablet Take 1 tablet (600 mg total) by mouth every 6 (six) hours. Patient not taking: Reported on 11/23/2023 10/26/23   Jhonny Augustin BROCKS, MD  Levonorg-Eth Estr, Transderm, (TWIRLA ) 120-30 MCG/24HR PTWK Place one patch onto the skin once weekly. Change it on the same day every week. On the 4th week remove the patch and go patch for for one week. Rotate site weekly. 12/15/23   Delores Nidia CROME, FNP  Prenatal Vit-Fe Fumarate-FA (PREPLUS) 27-1 MG TABS Take 1 tablet by mouth daily. Patient not taking: Reported on 11/23/2023 09/07/23   Nicholaus Burnard HERO, MD  senna-docusate (SENOKOT-S) 8.6-50 MG tablet Take 2 tablets by mouth daily. Patient not taking: Reported on 11/23/2023 10/26/23   Jhonny Augustin BROCKS, MD  terconazole  (TERAZOL 3 ) 0.8 % vaginal cream Place 1 applicator vaginally at bedtime. Apply nightly for three nights. Patient not taking: Reported on 11/23/2023 10/12/23   Zina Jerilynn LABOR, MD    Allergies: Patient has no known allergies.    Review of Systems  Genitourinary:  Positive for pelvic pain and  vaginal bleeding.  All other systems reviewed and are negative.   Updated Vital Signs BP 112/74 (BP Location: Right Arm)   Pulse 89   Temp 98.5 F (36.9 C) (Oral)   Resp 14   Ht 5' 3 (1.6 m)   Wt 48.1 kg   LMP 02/02/2024 (Exact Date)   SpO2 99%   BMI 18.78 kg/m   Physical Exam  (all labs ordered are listed, but only abnormal results are displayed) Labs Reviewed  CBC WITH DIFFERENTIAL/PLATELET - Abnormal; Notable for the following components:      Result Value   RBC 3.85 (*)    Hemoglobin 11.1 (*)    HCT 34.2 (*)    All other components within normal limits  HCG, QUANTITATIVE, PREGNANCY - Abnormal; Notable for the following components:   hCG, Beta Chain, Quant, S 376 (*)    All other components within normal limits  PREGNANCY, URINE  ABO/RH    EKG: None  Radiology: No results found.  {Document cardiac monitor, telemetry assessment procedure when appropriate:32947} Procedures   Medications Ordered in the ED - No data to display    {Click here for ABCD2, HEART and other calculators REFRESH Note before signing:1}                              Medical Decision Making Amount and/or Complexity of Data Reviewed Labs: ordered. Radiology:  ordered.   ***  {Document critical care time when appropriate  Document review of labs and clinical decision tools ie CHADS2VASC2, etc  Document your independent review of radiology images and any outside records  Document your discussion with family members, caretakers and with consultants  Document social determinants of health affecting pt's care  Document your decision making why or why not admission, treatments were needed:32947:::1}   Final diagnoses:  None    ED Discharge Orders     None        "

## 2024-03-15 NOTE — ED Provider Notes (Signed)
 " [place] EMERGENCY DEPARTMENT AT [facility] Provider Note   CSN: [id number] Arrival date & time: 03/15/24  1801     Patient presents with: Vaginal Bleeding   Tina Castaneda is a 19 y.o. female.  19 year old female presents emergency department with complaints of vaginal bleeding and pelvic cramping for the last 2 days.  Patient reports she had a positive pregnancy test last Monday and her last menstrual period was December 2.  Patient reports she had a uncomplicated vaginal birth 4 months ago.  Patient reports some intermittent cramping over the last couple days as well and passing several clots with continued bleeding.  Patient reports she is feeling up about 1 pad every 2 hours.  She switched to the postpartum pads and they have been handling the bleeding much better.  No dizziness, shortness of breath, syncope, nausea, vomiting.     Prior to Admission medications  Medication Sig Start Date End Date Taking? Authorizing Provider  acetaminophen  (TYLENOL ) 325 MG tablet Take 2 tablets (650 mg total) by mouth every 4 (four) hours as needed (for pain scale < 4). Patient not taking: Reported on 11/23/2023 10/26/23   Jhonny Augustin BROCKS, MD  ferrous sulfate  325 (65 FE) MG tablet Take 1 tablet (325 mg total) by mouth daily with breakfast. Patient not taking: Reported on 11/23/2023 10/26/23   Jhonny Augustin BROCKS, MD  hydrOXYzine  (ATARAX ) 25 MG tablet Take 1 tablet (25 mg total) by mouth every 6 (six) hours as needed for itching. Patient not taking: Reported on 11/23/2023 10/18/23   Constant, Peggy, MD  ibuprofen  (ADVIL ) 600 MG tablet Take 1 tablet (600 mg total) by mouth every 6 (six) hours. Patient not taking: Reported on 11/23/2023 10/26/23   Jhonny Augustin BROCKS, MD  Levonorg-Eth Estr, Transderm, (TWIRLA ) 120-30 MCG/24HR PTWK Place one patch onto the skin once weekly. Change it on the same day every week. On the 4th week remove the patch and go patch for for one week. Rotate site weekly.  12/15/23   Delores Nidia CROME, FNP  Prenatal Vit-Fe Fumarate-FA (PREPLUS) 27-1 MG TABS Take 1 tablet by mouth daily. Patient not taking: Reported on 11/23/2023 09/07/23   Nicholaus Burnard HERO, MD  senna-docusate (SENOKOT-S) 8.6-50 MG tablet Take 2 tablets by mouth daily. Patient not taking: Reported on 11/23/2023 10/26/23   Jhonny Augustin BROCKS, MD  terconazole  (TERAZOL 3 ) 0.8 % vaginal cream Place 1 applicator vaginally at bedtime. Apply nightly for three nights. Patient not taking: Reported on 11/23/2023 10/12/23   Zina Jerilynn LABOR, MD    Allergies: Patient has no known allergies.    Review of Systems  Genitourinary:  Positive for pelvic pain and vaginal bleeding.  All other systems reviewed and are negative.   Updated Vital Signs BP 118/66   Pulse 70   Temp 98.1 F (36.7 C) (Oral)   Resp 16   Ht 5' 3 (1.6 m)   Wt 48.1 kg   LMP 02/02/2024 (Exact Date)   SpO2 98%   BMI 18.78 kg/m   Physical Exam Vitals and nursing note reviewed.  Constitutional:      Appearance: Normal appearance.  HENT:     Head: Normocephalic and atraumatic.     Nose: Nose normal.  Eyes:     Extraocular Movements: Extraocular movements intact.     Conjunctiva/sclera: Conjunctivae normal.     Pupils: Pupils are equal, round, and reactive to light.  Cardiovascular:     Rate and Rhythm: Normal rate.  Pulmonary:  Effort: Pulmonary effort is normal. No respiratory distress.     Breath sounds: Normal breath sounds.  Abdominal:     General: Abdomen is flat.     Palpations: Abdomen is soft.     Tenderness: There is no abdominal tenderness. There is no guarding.     Comments: Patient has no pain to palpation.  Genitourinary:    Comments: Nurse was present during entire pelvic exam.  There was some blood noted throughout the vaginal canal, the os is closed and no products of conception noted in the os.  No areas on the vaginal wall noted to be having active bleeding. Musculoskeletal:        General: Normal range of  motion.     Cervical back: Normal range of motion.  Skin:    General: Skin is warm.     Capillary Refill: Capillary refill takes less than 2 seconds.  Neurological:     General: No focal deficit present.     Mental Status: She is alert.  Psychiatric:        Mood and Affect: Mood normal.        Behavior: Behavior normal.     (all labs ordered are listed, but only abnormal results are displayed) Labs Reviewed  CBC WITH DIFFERENTIAL/PLATELET - Abnormal; Notable for the following components:      Result Value   RBC 3.85 (*)    Hemoglobin 11.1 (*)    HCT 34.2 (*)    All other components within normal limits  HCG, QUANTITATIVE, PREGNANCY - Abnormal; Notable for the following components:   hCG, Beta Chain, Quant, S 376 (*)    All other components within normal limits  ABO/RH    EKG: None  Radiology: US  OB LESS THAN 14 WEEKS WITH OB TRANSVAGINAL Result Date: 03/15/2024 EXAM: ULTRASOUND FIRST TRIMESTER TECHNIQUE: Transabdominal and Transvaginal first trimester obstetric pelvic duplex ultrasound was performed with real-time imaging, color flow Doppler imaging, and spectral analysis. COMPARISON: None available. CLINICAL HISTORY: Vaginal bleeding; Pelvic pain. pelvic pain and vaginal bleeding FINDINGS: UTERUS: The uterus is anteverted. No intrauterine gestational sac is identified. However, during the course of the examination, an irregular cystic structure is seen advancing from the endometrial cavity within the mid body of the uterus into the lower uterine segment, possibly reflecting an abortion in progress. The endometrium is uniformly thin measuring up to 3 mm in thickness. No focal myometrial mass. GESTATIONAL SAC(S): No intrauterine gestational sac is identified. No subchorionic hemorrhage. YOLK SAC: Not identified. EMBRYO(<11WK) /FETUS(>=11WK): Not identified. CROWN RUMP LENGTH: Not measured. RATE OF CARDIAC ACTIVITY: Not measured. RIGHT OVARY: Unremarkable. Normal arterial and venous  flow. LEFT OVARY: Unremarkable. Normal arterial and venous flow. No adnexal masses are seen. FREE FLUID: A trace amount of simple-appearing free fluid is seen within the cul-de-sac. MEASUREMENTS ESTIMATED GESTATIONAL AGE BY CURRENT ULTRASOUND: Not applicable as no intrauterine gestational sac is identified. ESTIMATED GESTATIONAL AGE BY LMP/PRIOR ULTRASOUND: Not provided. ESTIMATED DUE DATE: Not applicable. IMPRESSION: 1. No intrauterine gestational sac identified, with irregular cystic structure advancing from the endometrial cavity into the lower uterine segment, which may reflect abortion in progress. Correlation with serial feeding ecg examination is recommended with possible follow-up sonography in 3-5 days to document completion. Electronically signed by: Dorethia Molt MD 03/15/2024 10:42 PM EST RP Workstation: [id number]     Procedures   Medications Ordered in the ED - No data to display  19 y.o. female presents to the ED with complaints of vaginal bleeding and intermittent cramping  x 2 days,  The differential diagnosis includes miscarriage, ectopic pregnancy, abnormal uterine bleeding, hemorrhagic cyst, torsion (Ddx)  On arrival pt is nontoxic, vitals unremarkable. Exam significant for reported intermittent pelvic cramping and vaginal bleeding   Lab Tests:  CBC hCG quantitative ordered in triage.  ABO/Rh ordered in triage hCG at 376 correlates to 2 to [redacted] weeks pregnant.  Hemoglobin mildly decreased at 11.1  Imaging Studies ordered:  I ordered imaging studies which included pelvic ultrasound no intrauterine gestational sac identified, there is an irregular cystic structure advancing lower into the uterus consistent with abortion in progress recommended to follow-up hCG.   ED Course:   Patient is sitting comfortably in ED bed in no acute distress nontoxic-appearing.  hCG is low for suspected length of pregnancy.  Ultrasound is consistent with abortion in progress with irregular cystic  structure moving towards the uterine canal.  No signs of ectopic pregnancy, hemoglobin is stable, no active bleeding throughout vaginal canal.  Patient was advised to follow-up with OB/GYN in 48 to 72 hours for reevaluation of hCG for concerns of miscarriage.  Portions of this note were generated with Scientist, clinical (histocompatibility and immunogenetics). Dictation errors may occur despite best attempts at proofreading.   Final diagnoses:  Vaginal bleeding before [redacted] weeks gestation    ED Discharge Orders     None          Myriam Fonda GORMAN DEVONNA 03/17/24 0912    Pamella Ozell LABOR, DO 03/21/24 2349  "

## 2024-03-15 NOTE — Discharge Instructions (Addendum)
 There was some concerns today in the emergency department that may indicate a miscarriage.  It is very important to follow-up with your OB/GYN in the next couple of days to repeat your hCG levels to confirm.  Please call them tomorrow to establish an appointment for reevaluation of hCG levels.  Please take Tylenol  as needed for pain.  If you experience any concerning new or worsening symptoms please return to the emergency department for further evaluation.  Some symptoms would include dizziness, fainting, continuous bleeding that fills up more than 1 pad per hour, chest pain, or new abdominal or pelvic pain.

## 2024-03-16 LAB — ABO/RH: ABO/RH(D): O POS

## 2024-03-17 ENCOUNTER — Ambulatory Visit: Payer: MEDICAID
